# Patient Record
Sex: Female | Born: 1965
Health system: Southern US, Community
[De-identification: ages and names within clinical notes are randomized; demographics above are authoritative.]

## PROBLEM LIST (undated history)

## (undated) DIAGNOSIS — C50919 Malignant neoplasm of unspecified site of unspecified female breast: Secondary | ICD-10-CM

## (undated) DIAGNOSIS — F32A Depression, unspecified: Secondary | ICD-10-CM

## (undated) DIAGNOSIS — K219 Gastro-esophageal reflux disease without esophagitis: Secondary | ICD-10-CM

## (undated) DIAGNOSIS — C801 Malignant (primary) neoplasm, unspecified: Secondary | ICD-10-CM

## (undated) DIAGNOSIS — F329 Major depressive disorder, single episode, unspecified: Secondary | ICD-10-CM

## (undated) DIAGNOSIS — G43909 Migraine, unspecified, not intractable, without status migrainosus: Secondary | ICD-10-CM

## (undated) DIAGNOSIS — E7800 Pure hypercholesterolemia, unspecified: Secondary | ICD-10-CM

## (undated) HISTORY — PX: BREAST SURGERY: SHX581

## (undated) HISTORY — DX: Major depressive disorder, single episode, unspecified: F32.9

## (undated) HISTORY — DX: Migraine, unspecified, not intractable, without status migrainosus: G43.909

## (undated) HISTORY — DX: Depression, unspecified: F32.A

## (undated) HISTORY — DX: Malignant (primary) neoplasm, unspecified: C80.1

## (undated) HISTORY — PX: TONSILLECTOMY: SUR1361

---

## 1998-01-21 ENCOUNTER — Other Ambulatory Visit: Admission: RE | Admit: 1998-01-21 | Discharge: 1998-01-21 | Payer: Self-pay | Admitting: Obstetrics and Gynecology

## 1998-07-25 ENCOUNTER — Emergency Department (HOSPITAL_COMMUNITY): Admission: EM | Admit: 1998-07-25 | Discharge: 1998-07-25 | Payer: Self-pay | Admitting: Emergency Medicine

## 1999-01-26 ENCOUNTER — Other Ambulatory Visit: Admission: RE | Admit: 1999-01-26 | Discharge: 1999-01-26 | Payer: Self-pay | Admitting: Obstetrics and Gynecology

## 2000-01-28 ENCOUNTER — Other Ambulatory Visit: Admission: RE | Admit: 2000-01-28 | Discharge: 2000-01-28 | Payer: Self-pay | Admitting: Obstetrics and Gynecology

## 2001-03-06 ENCOUNTER — Other Ambulatory Visit: Admission: RE | Admit: 2001-03-06 | Discharge: 2001-03-06 | Payer: Self-pay | Admitting: Obstetrics and Gynecology

## 2001-10-03 DIAGNOSIS — C801 Malignant (primary) neoplasm, unspecified: Secondary | ICD-10-CM

## 2001-10-03 DIAGNOSIS — C50919 Malignant neoplasm of unspecified site of unspecified female breast: Secondary | ICD-10-CM

## 2001-10-03 HISTORY — PX: MASTECTOMY: SHX3

## 2001-10-03 HISTORY — DX: Malignant neoplasm of unspecified site of unspecified female breast: C50.919

## 2001-10-03 HISTORY — DX: Malignant (primary) neoplasm, unspecified: C80.1

## 2003-10-01 ENCOUNTER — Encounter: Admission: RE | Admit: 2003-10-01 | Discharge: 2003-10-01 | Payer: Self-pay | Admitting: Obstetrics and Gynecology

## 2003-10-02 ENCOUNTER — Encounter: Admission: RE | Admit: 2003-10-02 | Discharge: 2003-10-02 | Payer: Self-pay | Admitting: Obstetrics and Gynecology

## 2003-10-24 ENCOUNTER — Encounter (HOSPITAL_COMMUNITY): Admission: RE | Admit: 2003-10-24 | Discharge: 2004-01-22 | Payer: Self-pay | Admitting: Surgery

## 2003-10-30 ENCOUNTER — Encounter: Admission: RE | Admit: 2003-10-30 | Discharge: 2003-10-30 | Payer: Self-pay | Admitting: Surgery

## 2003-10-30 ENCOUNTER — Ambulatory Visit (HOSPITAL_COMMUNITY): Admission: RE | Admit: 2003-10-30 | Discharge: 2003-10-30 | Payer: Self-pay | Admitting: Surgery

## 2003-10-30 ENCOUNTER — Ambulatory Visit (HOSPITAL_BASED_OUTPATIENT_CLINIC_OR_DEPARTMENT_OTHER): Admission: RE | Admit: 2003-10-30 | Discharge: 2003-10-30 | Payer: Self-pay | Admitting: Surgery

## 2003-11-19 ENCOUNTER — Ambulatory Visit (HOSPITAL_COMMUNITY): Admission: RE | Admit: 2003-11-19 | Discharge: 2003-11-20 | Payer: Self-pay | Admitting: Surgery

## 2004-10-01 ENCOUNTER — Encounter: Admission: RE | Admit: 2004-10-01 | Discharge: 2004-10-01 | Payer: Self-pay | Admitting: Surgery

## 2007-07-10 ENCOUNTER — Ambulatory Visit: Payer: Self-pay

## 2008-10-06 ENCOUNTER — Ambulatory Visit: Payer: Self-pay

## 2008-10-09 ENCOUNTER — Ambulatory Visit: Payer: Self-pay

## 2009-12-02 ENCOUNTER — Ambulatory Visit: Payer: Self-pay

## 2010-12-14 ENCOUNTER — Ambulatory Visit: Payer: Self-pay | Admitting: Internal Medicine

## 2012-01-30 ENCOUNTER — Ambulatory Visit: Payer: Self-pay | Admitting: Internal Medicine

## 2012-11-29 ENCOUNTER — Ambulatory Visit: Payer: Self-pay | Admitting: Neurology

## 2013-01-30 ENCOUNTER — Ambulatory Visit: Payer: Self-pay | Admitting: Internal Medicine

## 2014-02-03 ENCOUNTER — Ambulatory Visit: Payer: Self-pay | Admitting: Internal Medicine

## 2015-02-09 ENCOUNTER — Other Ambulatory Visit: Payer: Self-pay

## 2015-02-09 DIAGNOSIS — G43009 Migraine without aura, not intractable, without status migrainosus: Secondary | ICD-10-CM | POA: Insufficient documentation

## 2015-02-09 DIAGNOSIS — Z6841 Body Mass Index (BMI) 40.0 and over, adult: Secondary | ICD-10-CM

## 2015-02-09 DIAGNOSIS — Z853 Personal history of malignant neoplasm of breast: Secondary | ICD-10-CM | POA: Insufficient documentation

## 2015-02-09 DIAGNOSIS — F325 Major depressive disorder, single episode, in full remission: Secondary | ICD-10-CM | POA: Insufficient documentation

## 2015-02-09 DIAGNOSIS — E7800 Pure hypercholesterolemia, unspecified: Secondary | ICD-10-CM | POA: Insufficient documentation

## 2015-02-09 DIAGNOSIS — Z1231 Encounter for screening mammogram for malignant neoplasm of breast: Secondary | ICD-10-CM

## 2015-02-12 ENCOUNTER — Telehealth: Payer: Self-pay

## 2015-02-12 ENCOUNTER — Ambulatory Visit (INDEPENDENT_AMBULATORY_CARE_PROVIDER_SITE_OTHER): Payer: Medicare Other | Admitting: Family Medicine

## 2015-02-12 VITALS — BP 120/80 | HR 75 | Temp 98.2°F | Resp 16 | Ht 65.0 in | Wt 238.0 lb

## 2015-02-12 DIAGNOSIS — M549 Dorsalgia, unspecified: Secondary | ICD-10-CM | POA: Diagnosis not present

## 2015-02-12 DIAGNOSIS — L989 Disorder of the skin and subcutaneous tissue, unspecified: Secondary | ICD-10-CM

## 2015-02-12 DIAGNOSIS — N393 Stress incontinence (female) (male): Secondary | ICD-10-CM | POA: Diagnosis not present

## 2015-02-12 DIAGNOSIS — N898 Other specified noninflammatory disorders of vagina: Secondary | ICD-10-CM

## 2015-02-12 DIAGNOSIS — R32 Unspecified urinary incontinence: Secondary | ICD-10-CM

## 2015-02-12 DIAGNOSIS — R300 Dysuria: Secondary | ICD-10-CM

## 2015-02-12 DIAGNOSIS — R238 Other skin changes: Secondary | ICD-10-CM

## 2015-02-12 LAB — POCT UA - MICROSCOPIC ONLY
Bacteria, U Microscopic: NEGATIVE
Casts, Ur, LPF, POC: NEGATIVE
Crystals, Ur, HPF, POC: NEGATIVE
Mucus, UA: NEGATIVE
RBC, urine, microscopic: NEGATIVE
WBC, Ur, HPF, POC: NEGATIVE
Yeast, UA: NEGATIVE

## 2015-02-12 LAB — POCT URINALYSIS DIPSTICK
Bilirubin, UA: NEGATIVE
Blood, UA: NEGATIVE
Glucose, UA: NEGATIVE
Ketones, UA: NEGATIVE
Leukocytes, UA: NEGATIVE
Nitrite, UA: NEGATIVE
Protein, UA: NEGATIVE
Spec Grav, UA: 1.015
Urobilinogen, UA: 0.2
pH, UA: 7.5

## 2015-02-12 LAB — POCT WET PREP WITH KOH
Clue Cells Wet Prep HPF POC: NEGATIVE
KOH Prep POC: NEGATIVE
Trichomonas, UA: NEGATIVE
WBC Wet Prep HPF POC: NEGATIVE
Yeast Wet Prep HPF POC: NEGATIVE

## 2015-02-12 NOTE — Progress Notes (Signed)
Chief Complaint:  Chief Complaint  Patient presents with  . Dysuria    Onset "a while"  . Vaginal Discharge    HPI: Bianca Fletcher is a 49 y.o. female who is here for vaginal dc for a "while", she went to see her ob.gyn Dr Ouida Sills and had a pap and also urine testing and wet prep this week and all was negative. She sttes she ahs some whit dc coming out of her vaginand and some burnign when she urinates. She denies fevers, chills, n/v/abd pain/pelvic pain She has not tried anything for this. She has has some irriation around her vaginal area. But it was from urinating so much. + odor. + back pain LMP 01/22/15  Past Medical History  Diagnosis Date  . Depression    Past Surgical History  Procedure Laterality Date  . Breast surgery     History   Social History  . Marital Status: Divorced    Spouse Name: N/A  . Number of Children: N/A  . Years of Education: N/A   Social History Main Topics  . Smoking status: Former Research scientist (life sciences)  . Smokeless tobacco: Not on file  . Alcohol Use: No  . Drug Use: No  . Sexual Activity: Not on file   Other Topics Concern  . None   Social History Narrative  . None   History reviewed. No pertinent family history. Allergies  Allergen Reactions  . Penicillins Rash   Prior to Admission medications   Medication Sig Start Date End Date Taking? Authorizing Provider  buPROPion (WELLBUTRIN XL) 150 MG 24 hr tablet Take 150 mg by mouth 2 (two) times daily.   Yes Historical Provider, MD  etodolac (LODINE) 400 MG tablet Take 400 mg by mouth 2 (two) times daily.   Yes Historical Provider, MD  propranolol (INDERAL) 80 MG tablet Take 80 mg by mouth once.   Yes Historical Provider, MD  simvastatin (ZOCOR) 20 MG tablet Take 20 mg by mouth daily.   Yes Historical Provider, MD  topiramate (TOPAMAX) 50 MG tablet Take 50 mg by mouth 2 (two) times daily.   Yes Historical Provider, MD     ROS: The patient denies fevers, chills, night sweats, unintentional  weight loss, chest pain, palpitations, wheezing, dyspnea on exertion, nausea, vomiting, abdominal pain,  hematuria, melena, numbness, weakness, or tingling.   All other systems have been reviewed and were otherwise negative with the exception of those mentioned in the HPI and as above.    PHYSICAL EXAM: Filed Vitals:   02/12/15 1216  BP: 120/80  Pulse: 75  Temp: 98.2 F (36.8 C)  Resp: 16   Filed Vitals:   02/12/15 1216  Height: 5\' 5"  (1.651 m)  Weight: 238 lb (107.956 kg)   Body mass index is 39.61 kg/(m^2).  General: Alert, anxious HEENT:  Normocephalic, atraumatic, oropharynx patent. EOMI, PERRLA Cardiovascular:  Regular rate and rhythm, no rubs murmurs or gallops.  Radial pulse intact. No pedal edema.  Respiratory: Clear to auscultation bilaterally.  No wheezes, rales, or rhonchi.  No cyanosis, no use of accessory musculature GI: No organomegaly, abdomen is soft and non-tender, positive bowel sounds.  No masses. Skin: No rashes. Neurologic: Facial musculature symmetric. Psychiatric: Patient is appropriate throughout our interaction. Lymphatic: No cervical lymphadenopathy Musculoskeletal: Gait intact. Perivaginal area slightly irritated.    LABS: Results for orders placed or performed in visit on 02/12/15  POCT UA - Microscopic Only  Result Value Ref Range   WBC, Ur,  HPF, POC neg    RBC, urine, microscopic neg    Bacteria, U Microscopic neg    Mucus, UA neg    Epithelial cells, urine per micros 0-2    Crystals, Ur, HPF, POC neg    Casts, Ur, LPF, POC neg    Yeast, UA neg   POCT urinalysis dipstick  Result Value Ref Range   Color, UA yellow    Clarity, UA clear    Glucose, UA neg    Bilirubin, UA neg    Ketones, UA neg    Spec Grav, UA 1.015    Blood, UA neg    pH, UA 7.5    Protein, UA neg    Urobilinogen, UA 0.2    Nitrite, UA neg    Leukocytes, UA Negative   POCT Wet Prep with KOH  Result Value Ref Range   Trichomonas, UA Negative    Clue Cells Wet  Prep HPF POC neg    Epithelial Wet Prep HPF POC 10-15    Yeast Wet Prep HPF POC neg    Bacteria Wet Prep HPF POC trace    RBC Wet Prep HPF POC 0-1    WBC Wet Prep HPF POC neg    KOH Prep POC Negative      EKG/XRAY:   Primary read interpreted by Dr. Marin Comment at [facility].   ASSESSMENT/PLAN: Encounter Diagnoses  Name Primary?  . Incontinence in female Yes  . Skin irritation   . Back pain, unspecified location   . Dysuria    49 yo female with PMH of Depression who is here for vaginal dc and also dysuria for over 2 weeks. She is UTD on pap , normal and also saw her ob gyn who states she does not have anything on exam.  Patient left without waiting for results, our interaction was slightly rushed and patient was anxious She was slightly tangential and yet fixated on vaginal dc Labs pending Fu prn   Gross sideeffects, risk and benefits, and alternatives of medications d/w patient. Patient is aware that all medications have potential sideeffects and we are unable to predict every sideeffect or drug-drug interaction that may occur.  LE, [place], DO 02/12/2015 2:20 PM   02/17/15--spoke with patient will rx diflucan x 1 only to see if help with vaginal dc and also irritation around skin. I told her it may be yeast but everything was negative on UA, wet prep, cx

## 2015-02-12 NOTE — Telephone Encounter (Signed)
Pt wants to know her lab results.  I advised her because she was just here, it will take some time and that we would call her with those results.  [phone]

## 2015-02-12 NOTE — Telephone Encounter (Signed)
I agree

## 2015-02-13 ENCOUNTER — Telehealth: Payer: Self-pay

## 2015-02-13 LAB — URINE CULTURE: Colony Count: 30000

## 2015-02-13 NOTE — Telephone Encounter (Signed)
Left message, labs are not back yet.

## 2015-02-13 NOTE — Telephone Encounter (Signed)
Patient called to get lab results. Please call! Patient hung up and didn't leave her best contact number.

## 2015-02-13 NOTE — Telephone Encounter (Signed)
Please call patient back wit her lab results at this number : [phone] She states she doesn't know her own phone number so I told her we can call the number that show's on the caller id. She also seems frustrated and asked several times when her lab results will be ready.

## 2015-02-14 NOTE — Telephone Encounter (Signed)
Pt is calling about her lab results °

## 2015-02-17 MED ORDER — FLUCONAZOLE 150 MG PO TABS: 150.0000 mg | ORAL_TABLET | Freq: Once | ORAL | Status: DC

## 2015-02-17 NOTE — Telephone Encounter (Signed)
Done sent in diflucan x 1, d/w patient labs, she needs to see ob.gyn if this continues

## 2015-02-20 ENCOUNTER — Ambulatory Visit
Admission: RE | Admit: 2015-02-20 | Discharge: 2015-02-20 | Disposition: A | Discharge status: Home / Self Care | Payer: Medicare Other | Source: Ambulatory Visit | Attending: Internal Medicine | Admitting: Internal Medicine

## 2015-02-20 DIAGNOSIS — Z1231 Encounter for screening mammogram for malignant neoplasm of breast: Secondary | ICD-10-CM | POA: Insufficient documentation

## 2015-02-20 DIAGNOSIS — Z9011 Acquired absence of right breast and nipple: Secondary | ICD-10-CM | POA: Insufficient documentation

## 2015-02-20 DIAGNOSIS — Z853 Personal history of malignant neoplasm of breast: Secondary | ICD-10-CM | POA: Insufficient documentation

## 2016-03-09 ENCOUNTER — Other Ambulatory Visit: Payer: Self-pay | Admitting: Internal Medicine

## 2016-03-09 DIAGNOSIS — Z1231 Encounter for screening mammogram for malignant neoplasm of breast: Secondary | ICD-10-CM

## 2016-03-17 ENCOUNTER — Other Ambulatory Visit: Payer: Self-pay | Admitting: Internal Medicine

## 2016-03-17 ENCOUNTER — Ambulatory Visit
Admission: RE | Admit: 2016-03-17 | Discharge: 2016-03-17 | Disposition: A | Discharge status: Home / Self Care | Payer: Medicare Other | Source: Ambulatory Visit | Attending: Internal Medicine | Admitting: Internal Medicine

## 2016-03-17 DIAGNOSIS — Z1231 Encounter for screening mammogram for malignant neoplasm of breast: Secondary | ICD-10-CM

## 2016-03-18 ENCOUNTER — Other Ambulatory Visit: Payer: Self-pay | Admitting: Internal Medicine

## 2016-03-18 DIAGNOSIS — Z1231 Encounter for screening mammogram for malignant neoplasm of breast: Secondary | ICD-10-CM

## 2016-06-29 ENCOUNTER — Ambulatory Visit: Payer: Medicare Other

## 2016-10-21 ENCOUNTER — Ambulatory Visit (HOSPITAL_COMMUNITY)
Admission: EM | Admit: 2016-10-21 | Discharge: 2016-10-21 | Disposition: A | Discharge status: Home / Self Care | Payer: Medicare Other | Attending: Family Medicine | Admitting: Family Medicine

## 2016-10-21 ENCOUNTER — Encounter (HOSPITAL_COMMUNITY): Payer: Self-pay | Admitting: *Deleted

## 2016-10-21 DIAGNOSIS — G8929 Other chronic pain: Secondary | ICD-10-CM | POA: Diagnosis not present

## 2016-10-21 DIAGNOSIS — M545 Low back pain, unspecified: Secondary | ICD-10-CM

## 2016-10-21 DIAGNOSIS — B9789 Other viral agents as the cause of diseases classified elsewhere: Secondary | ICD-10-CM

## 2016-10-21 DIAGNOSIS — J069 Acute upper respiratory infection, unspecified: Secondary | ICD-10-CM | POA: Diagnosis not present

## 2016-10-21 MED ORDER — DICLOFENAC SODIUM 75 MG PO TBEC: 75.0000 mg | DELAYED_RELEASE_TABLET | Freq: Two times a day (BID) | ORAL | 0 refills | Status: DC

## 2016-10-21 MED ORDER — DICLOFENAC SODIUM 75 MG PO TBEC
75.0000 mg | DELAYED_RELEASE_TABLET | Freq: Two times a day (BID) | ORAL | 0 refills | Status: DC
Start: 2016-10-21 — End: 2016-10-21

## 2016-10-21 NOTE — Discharge Instructions (Signed)
You most likely have a viral URI, I advise rest, plenty of fluids and management of symptoms with over the counter medicines. For symptoms you may take Tylenol as needed every 4-6 hours for body aches or fever, not to exceed 4,000 mg a day, Take mucinex or mucinex DM ever 12 hours with a full glass of water, you may use an inhaled steroid such as Flonase, 2 sprays each nostril once a day for congestion, or an antihistamine such as Claritin or Zyrtec once a day. Should your symptoms worsen or fail to resolve, follow up with your primary care provider or return to clinic.   For your back pain, you do not have any current signs or symptoms that concerning such as impingement signs, fever, loss of sensation or motor strength. I have written a prescription for Diclofenac, take 1 tablet twice a day. I recommend you establish with a primary care provider for further evaluation and management of your pain.

## 2016-10-21 NOTE — ED Provider Notes (Signed)
CSN: [id number]     Arrival date & time 10/21/16  1000 History   First MD Initiated Contact with Patient 10/21/16 1030     Chief Complaint  Patient presents with  . URI   (Consider location/radiation/quality/duration/timing/severity/associated sxs/prior Treatment) 51 year old female presents with 4 day history of congestion, no nausea, no vomiting, no diarrhea, no abdominal pain, no sinus pain or tenderness, no headache or ear pain. She also complains of 1-2 month history of lower back pain. Pain is not affected by movement, no history of trauma. Pain does not radiate down either leg or up her back. She has no loss of sensation in extremities, no loss of control of bowel or bladder, no fever or other red flag signs. She states her pain comes and goes and she reports she is not in pain at time of assessment but was hurting this morning.   The history is provided by the patient.  URI    Past Medical History:  Diagnosis Date  . Depression    Past Surgical History:  Procedure Laterality Date  . BREAST SURGERY    . MASTECTOMY Right 2013   positive   Family History  Problem Relation Age of Onset  . Breast cancer Maternal Grandmother    Social History  Substance Use Topics  . Smoking status: Former Research scientist (life sciences)  . Smokeless tobacco: Not on file  . Alcohol use No   OB History    No data available     Review of Systems  Reason unable to perform ROS: as covered in HPI.  All other systems reviewed and are negative.   Allergies  Penicillins  Home Medications   Prior to Admission medications   Medication Sig Start Date End Date Taking? Authorizing Provider  buPROPion (WELLBUTRIN XL) 150 MG 24 hr tablet Take 150 mg by mouth 2 (two) times daily.    Historical Provider, MD  diclofenac (VOLTAREN) 75 MG EC tablet Take 1 tablet (75 mg total) by mouth 2 (two) times daily. 10/21/16   Barnet Glasgow, NP  etodolac (LODINE) 400 MG tablet Take 400 mg by mouth 2 (two) times daily.    Historical  Provider, MD  fluconazole (DIFLUCAN) 150 MG tablet Take 1 tablet (150 mg total) by mouth once. 02/17/15   Thao P Le, DO  propranolol (INDERAL) 80 MG tablet Take 80 mg by mouth once.    Historical Provider, MD  simvastatin (ZOCOR) 20 MG tablet Take 20 mg by mouth daily.    Historical Provider, MD  topiramate (TOPAMAX) 50 MG tablet Take 50 mg by mouth 2 (two) times daily.    Historical Provider, MD   Meds Ordered and Administered this Visit  Medications - No data to display  BP 139/91 (BP Location: Left Arm)   Pulse 83   Temp 98 F (36.7 C) (Oral)   Resp 16   LMP 09/20/2016   SpO2 98%  No data found.   Physical Exam  Constitutional: She is oriented to person, place, and time. She appears well-developed and well-nourished. She does not have a sickly appearance. She does not appear ill. No distress.  HENT:  Head: Normocephalic and atraumatic.  Right Ear: External ear normal.  Left Ear: External ear normal.  Nose: Nose normal. Right sinus exhibits no maxillary sinus tenderness and no frontal sinus tenderness. Left sinus exhibits no maxillary sinus tenderness and no frontal sinus tenderness.  Mouth/Throat: Uvula is midline, oropharynx is clear and moist and mucous membranes are normal.  Eyes: Conjunctivae  are normal. Pupils are equal, round, and reactive to light.  Neck: Normal range of motion. Neck supple. No JVD present.  Cardiovascular: Normal rate and regular rhythm.   No murmur heard. Pulmonary/Chest: Effort normal and breath sounds normal. No respiratory distress.  Abdominal: Soft. Bowel sounds are normal. There is no tenderness.  Musculoskeletal: Normal range of motion. She exhibits no edema, tenderness or deformity.       Lumbar back: She exhibits normal range of motion, no tenderness, no bony tenderness, no swelling, no edema, no deformity, no laceration, no pain and no spasm.  Pain pain with extension or flexion of the lower back, negative bilateral straight leg test, no spinal  tenderness, no tenderness in the muscle groups of the lower back.  Lymphadenopathy:    She has no cervical adenopathy.  Neurological: She is alert and oriented to person, place, and time.  Skin: Skin is warm and dry. Capillary refill takes less than 2 seconds. She is not diaphoretic.  Nursing note and vitals reviewed.   Urgent Care Course     Procedures (including critical care time)  Labs Review Labs Reviewed - No data to display  Imaging Review No results found.   Visual Acuity Review  Right Eye Distance:   Left Eye Distance:   Bilateral Distance:    Right Eye Near:   Left Eye Near:    Bilateral Near:         MDM   1. Viral upper respiratory tract infection   2. Chronic bilateral low back pain without sciatica   You most likely have a viral URI, I advise rest, plenty of fluids and management of symptoms with over the counter medicines. For symptoms you may take Tylenol as needed every 4-6 hours for body aches or fever, not to exceed 4,000 mg a day, Take mucinex or mucinex DM ever 12 hours with a full glass of water, you may use an inhaled steroid such as Flonase, 2 sprays each nostril once a day for congestion, or an antihistamine such as Claritin or Zyrtec once a day. Should your symptoms worsen or fail to resolve, follow up with your primary care provider or return to clinic.   For your back pain, you do not have any current signs or symptoms that concerning such as impingement signs, fever, loss of sensation or motor strength. I have written a prescription for Diclofenac, take 1 tablet twice a day. I recommend you establish with a primary care provider for further evaluation and management of your pain.      Barnet Glasgow, NP 10/21/16 1056

## 2016-10-21 NOTE — ED Triage Notes (Signed)
Pt  Reports      Symptoms  Of  Cough   Congested     And  Nasal  stufyyness         Non  prodictive   Cough     Not  Relieved  By  otc  meds

## 2016-10-25 ENCOUNTER — Encounter (HOSPITAL_COMMUNITY): Payer: Self-pay | Admitting: Emergency Medicine

## 2016-10-25 ENCOUNTER — Emergency Department (HOSPITAL_COMMUNITY)
Admission: EM | Admit: 2016-10-25 | Discharge: 2016-10-25 | Disposition: A | Discharge status: Home / Self Care | Payer: Medicare Other | Attending: Emergency Medicine | Admitting: Emergency Medicine

## 2016-10-25 ENCOUNTER — Emergency Department (HOSPITAL_COMMUNITY): Payer: Medicare Other

## 2016-10-25 DIAGNOSIS — Z87891 Personal history of nicotine dependence: Secondary | ICD-10-CM | POA: Diagnosis not present

## 2016-10-25 DIAGNOSIS — J111 Influenza due to unidentified influenza virus with other respiratory manifestations: Secondary | ICD-10-CM | POA: Diagnosis not present

## 2016-10-25 DIAGNOSIS — R69 Illness, unspecified: Secondary | ICD-10-CM

## 2016-10-25 DIAGNOSIS — J069 Acute upper respiratory infection, unspecified: Secondary | ICD-10-CM

## 2016-10-25 LAB — URINALYSIS, ROUTINE W REFLEX MICROSCOPIC
Bacteria, UA: NONE SEEN
Bilirubin Urine: NEGATIVE
GLUCOSE, UA: NEGATIVE mg/dL
KETONES UR: NEGATIVE mg/dL
Leukocytes, UA: NEGATIVE
NITRITE: NEGATIVE
PH: 6 (ref 5.0–8.0)
PROTEIN: NEGATIVE mg/dL
Specific Gravity, Urine: 1.006 (ref 1.005–1.030)

## 2016-10-25 MED ORDER — ALBUTEROL SULFATE HFA 108 (90 BASE) MCG/ACT IN AERS
2.0000 | INHALATION_SPRAY | Freq: Once | RESPIRATORY_TRACT | Status: AC
  Administered 2016-10-25: 2 via RESPIRATORY_TRACT
  Filled 2016-10-25: qty 6.7

## 2016-10-25 MED ORDER — ALBUTEROL SULFATE HFA 108 (90 BASE) MCG/ACT IN AERS: 1.0000 | INHALATION_SPRAY | Freq: Four times a day (QID) | RESPIRATORY_TRACT | 0 refills | Status: DC | PRN

## 2016-10-25 MED ORDER — ONDANSETRON 4 MG PO TBDP: 4.0000 mg | ORAL_TABLET | Freq: Once | ORAL | Status: DC

## 2016-10-25 MED ORDER — ONDANSETRON 4 MG PO TBDP: 4.0000 mg | ORAL_TABLET | Freq: Three times a day (TID) | ORAL | 0 refills | Status: DC | PRN

## 2016-10-25 MED ORDER — ONDANSETRON 4 MG PO TBDP
ORAL_TABLET | ORAL | Status: AC
  Filled 2016-10-25: qty 1

## 2016-10-25 MED ORDER — BENZONATATE 100 MG PO CAPS: 100.0000 mg | ORAL_CAPSULE | Freq: Three times a day (TID) | ORAL | 0 refills | Status: DC

## 2016-10-25 NOTE — ED Notes (Signed)
Pt vomiting in the waiting room

## 2016-10-25 NOTE — ED Provider Notes (Signed)
[place] DEPT Provider Note   CSN: [id number] Arrival date & time: 10/25/16  1231   By signing my name below, I, Neta Mends, attest that this documentation has been prepared under the direction and in the presence of Tanna Furry, MD . Electronically Signed: Neta Mends, ED Scribe. 10/25/2016. 4:16 PM.    History   Chief Complaint Chief Complaint  Patient presents with  . Influenza   The history is provided by the patient. No language interpreter was used.   HPI Comments:  Bianca Fletcher is a 51 y.o. female who presents to the Emergency Department complaining of multiple URI symptoms for 3 days. Pt complains of associated persistent cough x 1 day, sneezing, generalized body aches, rhinorrhea, nausea, bladder incontinence. Pt was seen at Urgent Care where she did not have fever and was told that she has a viral infection, but was referred to the ED. Pt was given OTC medications at urgent care that provided no relief. Pt denies other associated symptoms.    Past Medical History:  Diagnosis Date  . Depression     There are no active problems to display for this patient.   Past Surgical History:  Procedure Laterality Date  . BREAST SURGERY    . MASTECTOMY Right 2013   positive    OB History    No data available       Home Medications    Prior to Admission medications   Medication Sig Start Date End Date Taking? Authorizing Provider  albuterol (PROVENTIL HFA;VENTOLIN HFA) 108 (90 Base) MCG/ACT inhaler Inhale 1-2 puffs into the lungs every 6 (six) hours as needed for wheezing. 10/25/16   Tanna Furry, MD  benzonatate (TESSALON) 100 MG capsule Take 1 capsule (100 mg total) by mouth every 8 (eight) hours. 10/25/16   Tanna Furry, MD  buPROPion (WELLBUTRIN XL) 150 MG 24 hr tablet Take 150 mg by mouth 2 (two) times daily.    Historical Provider, MD  diclofenac (VOLTAREN) 75 MG EC tablet Take 1 tablet (75 mg total) by mouth 2 (two) times daily. 10/21/16   Barnet Glasgow, NP  etodolac (LODINE) 400 MG tablet Take 400 mg by mouth 2 (two) times daily.    Historical Provider, MD  fluconazole (DIFLUCAN) 150 MG tablet Take 1 tablet (150 mg total) by mouth once. 02/17/15   Thao P Le, DO  ondansetron (ZOFRAN ODT) 4 MG disintegrating tablet Take 1 tablet (4 mg total) by mouth every 8 (eight) hours as needed for nausea. 10/25/16   Tanna Furry, MD  propranolol (INDERAL) 80 MG tablet Take 80 mg by mouth once.    Historical Provider, MD  simvastatin (ZOCOR) 20 MG tablet Take 20 mg by mouth daily.    Historical Provider, MD  topiramate (TOPAMAX) 50 MG tablet Take 50 mg by mouth 2 (two) times daily.    Historical Provider, MD    Family History Family History  Problem Relation Age of Onset  . Breast cancer Maternal Grandmother     Social History Social History  Substance Use Topics  . Smoking status: Former Research scientist (life sciences)  . Smokeless tobacco: Never Used  . Alcohol use No     Allergies   Penicillins   Review of Systems Review of Systems  Constitutional: Negative for appetite change, chills, diaphoresis, fatigue and fever.  HENT: Positive for rhinorrhea and sneezing. Negative for mouth sores, sore throat and trouble swallowing.   Eyes: Negative for visual disturbance.  Respiratory: Positive for cough. Negative for chest  tightness, shortness of breath and wheezing.   Cardiovascular: Negative for chest pain.  Gastrointestinal: Positive for nausea. Negative for abdominal distention, abdominal pain, diarrhea and vomiting.  Endocrine: Negative for polydipsia, polyphagia and polyuria.  Genitourinary: Negative for dysuria, frequency and hematuria.       Positive for bowel incontinence.  Musculoskeletal: Positive for myalgias. Negative for gait problem.  Skin: Negative for color change, pallor and rash.  Neurological: Negative for dizziness, syncope, light-headedness and headaches.  Hematological: Does not bruise/bleed easily.  Psychiatric/Behavioral: Negative for  behavioral problems and confusion.  All other systems reviewed and are negative.    Physical Exam Updated Vital Signs BP 128/75 (BP Location: Left Arm)   Pulse 87   Temp 98.1 F (36.7 C) (Oral)   Resp 18   Ht 5\' 5"  (1.651 m)   Wt 250 lb (113.4 kg)   SpO2 96%   BMI 41.60 kg/m   Physical Exam  Constitutional: She is oriented to person, place, and time. She appears well-developed and well-nourished. No distress.  HENT:  Head: Normocephalic.  Eyes: Conjunctivae are normal. Pupils are equal, round, and reactive to light. No scleral icterus.  Neck: Normal range of motion. Neck supple. No thyromegaly present.  Cardiovascular: Normal rate and regular rhythm.  Exam reveals no gallop and no friction rub.   No murmur heard. Pulmonary/Chest: Effort normal and breath sounds normal. No respiratory distress. She has no wheezes. She has no rales.  Abdominal: Soft. Bowel sounds are normal. She exhibits no distension. There is no tenderness. There is no rebound.  Musculoskeletal: Normal range of motion.  Neurological: She is alert and oriented to person, place, and time.  Skin: Skin is warm and dry. No rash noted.  Psychiatric: She has a normal mood and affect. Her behavior is normal.     ED Treatments / Results  DIAGNOSTIC STUDIES:  Oxygen Saturation is 96% on RA, normal by my interpretation.    COORDINATION OF CARE:  4:12 PM Pt asked for codeine but this request was denied due to her current symptoms. Will order an inhaler and urinalysis. Discussed treatment plan with pt at bedside and pt agreed to plan.   Labs (all labs ordered are listed, but only abnormal results are displayed) Labs Reviewed  URINALYSIS, ROUTINE W REFLEX MICROSCOPIC - Abnormal; Notable for the following:       Result Value   Color, Urine STRAW (*)    Hgb urine dipstick SMALL (*)    Squamous Epithelial / LPF 0-5 (*)    All other components within normal limits    EKG  EKG Interpretation None        Radiology Dg Chest 2 View  Result Date: 10/25/2016 CLINICAL DATA:  Flu symptoms.  Cough. EXAM: CHEST  2 VIEW COMPARISON:  No recent prior. FINDINGS: Mediastinum and hilar structures normal. Mild peribronchial thickening. Mild bronchitis cannot be excluded . No pleural effusion or pneumothorax. Mild cardiomegaly. No acute bony abnormality . IMPRESSION: Mild peribronchial thickening.  Mild bronchitis cannot be excluded. Electronically Signed   By: Marcello Moores  Register   On: 10/25/2016 14:24    Procedures Procedures (including critical care time)  Medications Ordered in ED Medications  ondansetron (ZOFRAN-ODT) disintegrating tablet 4 mg (not administered)  ondansetron (ZOFRAN-ODT) 4 MG disintegrating tablet (not administered)  albuterol (PROVENTIL HFA;VENTOLIN HFA) 108 (90 Base) MCG/ACT inhaler 2 puff (2 puffs Inhalation Given 10/25/16 1641)     Initial Impression / Assessment and Plan / ED Course  I have reviewed the triage  vital signs and the nursing notes.  Pertinent labs & imaging results that were available during my care of the patient were reviewed by me and considered in my medical decision making (see chart for details).     Patient has a green-yellow difficulty understanding her diagnosis to be a simple viral upper respiratory infection. Out of the window for Tamiflu. Plan is symptomatically treatment. This was explained to her on multiple occasions. Hopefully we have reached a point of understanding. Plan discharge as albuterol, Tessalon, and Zofran. Patient does not appear ill or toxic and has normal vital signs. She is afebrile. She is not hypoxemic.  Final Clinical Impressions(s) / ED Diagnoses   Final diagnoses:  Influenza  Viral upper respiratory tract infection  Influenza-like illness    New Prescriptions New Prescriptions   ALBUTEROL (PROVENTIL HFA;VENTOLIN HFA) 108 (90 BASE) MCG/ACT INHALER    Inhale 1-2 puffs into the lungs every 6 (six) hours as needed for  wheezing.   BENZONATATE (TESSALON) 100 MG CAPSULE    Take 1 capsule (100 mg total) by mouth every 8 (eight) hours.   ONDANSETRON (ZOFRAN ODT) 4 MG DISINTEGRATING TABLET    Take 1 tablet (4 mg total) by mouth every 8 (eight) hours as needed for nausea.  I personally performed the services described in this documentation, which was scribed in my presence. The recorded information has been reviewed and is accurate.     Tanna Furry, MD 10/25/16 [phone]

## 2016-10-25 NOTE — Discharge Instructions (Signed)
Tessalon for cough.  Zofran for nausea.  Albuterol for wheezing or cough. Rest. Push fluids. Ibuprofen for body aches.

## 2016-10-25 NOTE — ED Triage Notes (Signed)
Pt reports cough sore throat and  Stuffy head x several days was seen at [facility] and sent here for tx

## 2016-11-07 ENCOUNTER — Ambulatory Visit (INDEPENDENT_AMBULATORY_CARE_PROVIDER_SITE_OTHER): Payer: Medicare Other | Admitting: Family Medicine

## 2016-11-07 VITALS — BP 118/80 | HR 72 | Temp 97.5°F | Resp 16 | Ht 65.0 in | Wt 230.0 lb

## 2016-11-07 DIAGNOSIS — R29898 Other symptoms and signs involving the musculoskeletal system: Secondary | ICD-10-CM | POA: Diagnosis not present

## 2016-11-07 DIAGNOSIS — R05 Cough: Secondary | ICD-10-CM | POA: Diagnosis not present

## 2016-11-07 DIAGNOSIS — R059 Cough, unspecified: Secondary | ICD-10-CM

## 2016-11-07 DIAGNOSIS — J209 Acute bronchitis, unspecified: Secondary | ICD-10-CM | POA: Diagnosis not present

## 2016-11-07 MED ORDER — GUAIFENESIN-CODEINE 100-10 MG/5ML PO SOLN: 5.0000 mL | Freq: Every evening | ORAL | 0 refills | Status: DC | PRN

## 2016-11-07 NOTE — Progress Notes (Signed)
By signing my name below, I, Mesha Guinyard, attest that this documentation has been prepared under the direction and in the presence of Merri Ray, MD.  Electronically Signed: Verlee Monte, Medical Scribe. 11/07/16. 3:04 PM.  Subjective:    Patient ID: Bianca Fletcher, female    DOB: 07/24/1966, 51 y.o.   MRN: [id number]  HPI Chief Complaint  Patient presents with  . Cough    x 4 weeks    HPI Comments: Bianca Fletcher is a 51 y.o. female who [facility] complaining of constant cough onset 4.5 weeks. Pt was seen at [facility] on the 19th with 1.5 weeks of cough, congestion, and nasal congestion (Approximately 4 days based on the note, but clarified with patient she had symptoms for a week to week and a half prior to that visit). Thought to be a viral URI and recommended mucinex DM, tylenol, flonase, and zyrtec. Pt was compliant with flonase, and mucinex and found some relief to her sxs. When she was seen Jan 23rd, she still had some cough without any changes - she was afebrile at both visits. She had a reassuring exam, CXR that showed mild peribronchial thickining, and mild bronchitis on the 23rd. Given zofran for nausea, albuterol, and tessalon perles for her cough. She didn't notice if those medications gave relief of her cough so she discontinued them. Pt switched to nyquil since the tessalon perles didn't work. Pt then saw another provider, Barth Kirks Redmon at [facility], and was put on septra DS on Jan 31st and steroid nasal spray. She now has sleep loss due to the coughing. Pt would like to get codeine for her cough, denies any past difficulty with narcotics, or current use. Pt deferred blood work and CXR today. Denies fever or negative side effects from her abx.  Reports intermittent leg weakness for 1 month and 5 days (onset "this year"). Pt needed assistance getting off the couch at times and had to use a wheelchair at store at a time, but not  persistent weakness.  Denies any change in weakness with current illness.  There are no active problems to display for this patient.  Past Medical History:  Diagnosis Date  . Depression    Past Surgical History:  Procedure Laterality Date  . BREAST SURGERY    . MASTECTOMY Right 2013   positive   Allergies  Allergen Reactions  . Penicillins Rash   Prior to Admission medications   Medication Sig Start Date End Date Taking? Authorizing Provider  albuterol (PROVENTIL HFA;VENTOLIN HFA) 108 (90 Base) MCG/ACT inhaler Inhale 1-2 puffs into the lungs every 6 (six) hours as needed for wheezing. 10/25/16  Yes Tanna Furry, MD  buPROPion (WELLBUTRIN XL) 150 MG 24 hr tablet Take 150 mg by mouth 2 (two) times daily.   Yes Historical Provider, MD  fluconazole (DIFLUCAN) 150 MG tablet Take 1 tablet (150 mg total) by mouth once. 02/17/15  Yes Thao P Le, DO  propranolol (INDERAL) 80 MG tablet Take 80 mg by mouth once.   Yes Historical Provider, MD  simvastatin (ZOCOR) 20 MG tablet Take 20 mg by mouth daily.   Yes Historical Provider, MD  topiramate (TOPAMAX) 50 MG tablet Take 50 mg by mouth 2 (two) times daily.   Yes Historical Provider, MD  etodolac (LODINE) 400 MG tablet Take 400 mg by mouth 2 (two) times daily.    Historical Provider, MD   Social History   Social History  .  Marital status: Divorced    Spouse name: N/A  . Number of children: N/A  . Years of education: N/A   Occupational History  . Not on file.   Social History Main Topics  . Smoking status: Former Research scientist (life sciences)  . Smokeless tobacco: Never Used  . Alcohol use No  . Drug use: No  . Sexual activity: Not on file   Other Topics Concern  . Not on file   Social History Narrative  . No narrative on file   Review of Systems  Constitutional: Negative for fever.  HENT: Negative for congestion.   Respiratory: Positive for cough.   Neurological: Positive for weakness.  Psychiatric/Behavioral: Positive for sleep disturbance.    Objective:  Physical Exam  Constitutional: She appears well-developed and well-nourished. No distress.  HENT:  Head: Normocephalic and atraumatic.  Right Ear: External ear normal.  Left Ear: External ear normal.  Mouth/Throat: Oropharynx is clear and moist. No oropharyngeal exudate.  Edematous turbinates bilaterally  Eyes: Conjunctivae are normal.  Neck: Neck supple.  Cardiovascular: Normal rate, regular rhythm and normal heart sounds.  Exam reveals no gallop and no friction rub.   No murmur heard. Pulmonary/Chest: Effort normal and breath sounds normal. No respiratory distress. She has no wheezes. She has no rales.  Neurological: She is alert.  Reflex Scores:      Patellar reflexes are 2+ on the right side and 2+ on the left side.      Achilles reflexes are 2+ on the right side and 2+ on the left side. Able to stand without difficulty No lower extremity weakness appreciated  Skin: Skin is warm and dry.  Psychiatric: She has a normal mood and affect. Her behavior is normal.  Nursing note and vitals reviewed.  BP 118/80 (BP Location: Right Arm, Patient Position: Sitting, Cuff Size: Large)   Pulse 72   Temp 97.5 F (36.4 C) (Oral)   Resp 16   Ht 5\' 5"  (1.651 m)   Wt 230 lb (104.3 kg)   LMP 11/03/2016 (Approximate)   SpO2 97%   BMI 38.27 kg/m  Assessment & Plan:  Over 30 mins coordination of care with clarification of history, discussion on phone with Ms. Barrie Folk and review of prior notes  Control substance registry reviewed, no listings.   Bianca Fletcher is a 51 y.o. female Acute bronchitis, unspecified organism - Plan: guaiFENesin-codeine 100-10 MG/5ML syrup Cough - Plan: guaiFENesin-codeine 100-10 MG/5ML syrup  - Suspected viral illness initially, but persistent cough. Chest x-ray in the ER indicated some component of bronchitis. No focal infiltrate. Lungs clear on exam in office today, reassuring vitals, O2 sat normal. Discussed option of repeat chest x-ray to determine  if any interval change since previous x-ray, but this and blood work was declined. She was recently started on antibiotic for bronchitis, and tolerating that medication at this time.  - Symptomatic care discussed with Mucinex or Mucinex DM over-the-counter during the day, and codeine/guaifenesin cough syrup was prescribed for nighttime use. Side effects and risks of that medication were discussed and understanding expressed. RTC precautions discussed if persistent symptoms or any worsening of her symptoms.  Complaints of leg weakness  - No weakness appreciated on exam in office, reflexes were equal and normal in lower extremities. Intermittent nature but long-standing concern, so recommended follow-up with primary care provider and may need neurology evaluation. ER/911 precautions discussed if acute onset of weakness occurs again.  Meds ordered this encounter  Medications  . sulfamethoxazole-trimethoprim (BACTRIM DS,SEPTRA DS) 800-160  MG tablet    Sig: TAKE 1 TABLET BY MOUTH EVERY 12 HRS FOR 10 DAYS    Refill:  0  . guaiFENesin-codeine 100-10 MG/5ML syrup    Sig: Take 5-10 mLs by mouth at bedtime as needed for cough.    Dispense:  120 mL    Refill:  0   Patient Instructions   Your previous x-ray indicated bronchitis, and early on that is most likely due to a virus. However, due to the timing of your symptoms, the other medical provider started you on an antibiotic on January 31st for possible bacterial cause. Your lungs were clear today, oxygen level was within normal range, and temperature was in normal range.  We discussed a blood count or x-ray due to the persistent symptoms, but it may be too soon to tell if the antibiotic has work. I would recommend Mucinex or Mucinex DM during the day, codeine cough syrup if needed at night. If you have fever, shortness of breath, or any worsening of your symptoms, return here, emergency room, or other medical provider.  As we discussed, I would recommend  evaluation with neurology for your leg weakness, but I do not appreciate any weakness on exam today in the office. If that weakness happens again, I recommend you be seen immediately in the emergency room or call 911.   Return to the clinic or go to the nearest emergency room if any of your symptoms worsen or new symptoms occur.   Acute Bronchitis, Adult Acute bronchitis is sudden (acute) swelling of the air tubes (bronchi) in the lungs. Acute bronchitis causes these tubes to fill with mucus, which can make it hard to breathe. It can also cause coughing or wheezing. In adults, acute bronchitis usually goes away within 2 weeks. A cough caused by bronchitis may last up to 3 weeks. Smoking, allergies, and asthma can make the condition worse. Repeated episodes of bronchitis may cause further lung problems, such as chronic obstructive pulmonary disease (COPD). What are the causes? This condition can be caused by germs and by substances that irritate the lungs, including:  Cold and flu viruses. This condition is most often caused by the same virus that causes a cold.  Bacteria.  Exposure to tobacco smoke, dust, fumes, and air pollution. What increases the risk? This condition is more likely to develop in people who:  Have close contact with someone with acute bronchitis.  Are exposed to lung irritants, such as tobacco smoke, dust, fumes, and vapors.  Have a weak immune system.  Have a respiratory condition such as asthma. What are the signs or symptoms? Symptoms of this condition include:  A cough.  Coughing up clear, yellow, or green mucus.  Wheezing.  Chest congestion.  Shortness of breath.  A fever.  Body aches.  Chills.  A sore throat. How is this diagnosed? This condition is usually diagnosed with a physical exam. During the exam, your health care provider may order tests, such as chest X-rays, to rule out other conditions. He or she may also:  Test a sample of your  mucus for bacterial infection.  Check the level of oxygen in your blood. This is done to check for pneumonia.  Do a chest X-ray or lung function testing to rule out pneumonia and other conditions.  Perform blood tests. Your health care provider will also ask about your symptoms and medical history. How is this treated? Most cases of acute bronchitis clear up over time without treatment. Your health care  provider may recommend:  Drinking more fluids. Drinking more makes your mucus thinner, which may make it easier to breathe.  Taking a medicine for a fever or cough.  Taking an antibiotic medicine.  Using an inhaler to help improve shortness of breath and to control a cough.  Using a cool mist vaporizer or humidifier to make it easier to breathe. Follow these instructions at home: Medicines  Take over-the-counter and prescription medicines only as told by your health care provider.  If you were prescribed an antibiotic, take it as told by your health care provider. Do not stop taking the antibiotic even if you start to feel better. General instructions  Get plenty of rest.  Drink enough fluids to keep your urine clear or pale yellow.  Avoid smoking and secondhand smoke. Exposure to cigarette smoke or irritating chemicals will make bronchitis worse. If you smoke and you need help quitting, ask your health care provider. Quitting smoking will help your lungs heal faster.  Use an inhaler, cool mist vaporizer, or humidifier as told by your health care provider.  Keep all follow-up visits as told by your health care provider. This is important. How is this prevented? To lower your risk of getting this condition again:  Wash your hands often with soap and water. If soap and water are not available, use hand sanitizer.  Avoid contact with people who have cold symptoms.  Try not to touch your hands to your mouth, nose, or eyes.  Make sure to get the flu shot every year. Contact a  health care provider if:  Your symptoms do not improve in 2 weeks of treatment. Get help right away if:  You cough up blood.  You have chest pain.  You have severe shortness of breath.  You become dehydrated.  You faint or keep feeling like you are going to faint.  You keep vomiting.  You have a severe headache.  Your fever or chills gets worse. This information is not intended to replace advice given to you by your health care provider. Make sure you discuss any questions you have with your health care provider. Document Released: 10/27/2004 Document Revised: 04/13/2016 Document Reviewed: 03/09/2016 Elsevier Interactive Patient Education  2017 [employer]   Cough, Adult Coughing is a reflex that clears your throat and your airways. Coughing helps to heal and protect your lungs. It is normal to cough occasionally, but a cough that happens with other symptoms or lasts a long time may be a sign of a condition that needs treatment. A cough may last only 2-3 weeks (acute), or it may last longer than 8 weeks (chronic). What are the causes? Coughing is commonly caused by:  Breathing in substances that irritate your lungs.  A viral or bacterial respiratory infection.  Allergies.  Asthma.  Postnasal drip.  Smoking.  Acid backing up from the stomach into the esophagus (gastroesophageal reflux).  Certain medicines.  Chronic lung problems, including COPD (or rarely, lung cancer).  Other medical conditions such as heart failure. Follow these instructions at home: Pay attention to any changes in your symptoms. Take these actions to help with your discomfort:  Take medicines only as told by your health care provider.  If you were prescribed an antibiotic medicine, take it as told by your health care provider. Do not stop taking the antibiotic even if you start to feel better.  Talk with your health care provider before you take a cough suppressant medicine.  Drink  enough fluid to  keep your urine clear or pale yellow.  If the air is dry, use a cold steam vaporizer or humidifier in your bedroom or your home to help loosen secretions.  Avoid anything that causes you to cough at work or at home.  If your cough is worse at night, try sleeping in a semi-upright position.  Avoid cigarette smoke. If you smoke, quit smoking. If you need help quitting, ask your health care provider.  Avoid caffeine.  Avoid alcohol.  Rest as needed. Contact a health care provider if:  You have new symptoms.  You cough up pus.  Your cough does not get better after 2-3 weeks, or your cough gets worse.  You cannot control your cough with suppressant medicines and you are losing sleep.  You develop pain that is getting worse or pain that is not controlled with pain medicines.  You have a fever.  You have unexplained weight loss.  You have night sweats. Get help right away if:  You cough up blood.  You have difficulty breathing.  Your heartbeat is very fast. This information is not intended to replace advice given to you by your health care provider. Make sure you discuss any questions you have with your health care provider. Document Released: 03/18/2011 Document Revised: 02/25/2016 Document Reviewed: 11/26/2014 Elsevier Interactive Patient Education  2017 [employer].    IF you received an x-ray today, you will receive an invoice from [facility]. Please contact [facility] at [phone] with questions or concerns regarding your invoice.   IF you received labwork today, you will receive an invoice from [facility]. Please contact [facility] at [phone] with questions or concerns regarding your invoice.   Our billing staff will not be able to assist you with questions regarding bills from these companies.  You will be contacted with the lab results as soon as they are available. The fastest way to get your results is to activate your My  Chart account. Instructions are located on the last page of this paperwork. If you have not heard from [place] regarding the results in 2 weeks, please contact this office.       I personally performed the services described in this documentation, which was scribed in my presence. The recorded information has been reviewed and considered for accuracy and completeness, addended by me as needed, and agree with information above.  Signed,   Merri Ray, MD [facility].  11/07/16 7:53 PM

## 2016-11-07 NOTE — Patient Instructions (Addendum)
Your previous x-ray indicated bronchitis, and early on that is most likely due to a virus. However, due to the timing of your symptoms, the other medical provider started you on an antibiotic on January 31st for possible bacterial cause. Your lungs were clear today, oxygen level was within normal range, and temperature was in normal range.  We discussed a blood count or x-ray due to the persistent symptoms, but it may be too soon to tell if the antibiotic has work. I would recommend Mucinex or Mucinex DM during the day, codeine cough syrup if needed at night. If you have fever, shortness of breath, or any worsening of your symptoms, return here, emergency room, or other medical provider.  As we discussed, I would recommend evaluation with neurology for your leg weakness, but I do not appreciate any weakness on exam today in the office. If that weakness happens again, I recommend you be seen immediately in the emergency room or call 911.   Return to the clinic or go to the nearest emergency room if any of your symptoms worsen or new symptoms occur.   Acute Bronchitis, Adult Acute bronchitis is sudden (acute) swelling of the air tubes (bronchi) in the lungs. Acute bronchitis causes these tubes to fill with mucus, which can make it hard to breathe. It can also cause coughing or wheezing. In adults, acute bronchitis usually goes away within 2 weeks. A cough caused by bronchitis may last up to 3 weeks. Smoking, allergies, and asthma can make the condition worse. Repeated episodes of bronchitis may cause further lung problems, such as chronic obstructive pulmonary disease (COPD). What are the causes? This condition can be caused by germs and by substances that irritate the lungs, including:  Cold and flu viruses. This condition is most often caused by the same virus that causes a cold.  Bacteria.  Exposure to tobacco smoke, dust, fumes, and air pollution. What increases the risk? This condition is more  likely to develop in people who:  Have close contact with someone with acute bronchitis.  Are exposed to lung irritants, such as tobacco smoke, dust, fumes, and vapors.  Have a weak immune system.  Have a respiratory condition such as asthma. What are the signs or symptoms? Symptoms of this condition include:  A cough.  Coughing up clear, yellow, or green mucus.  Wheezing.  Chest congestion.  Shortness of breath.  A fever.  Body aches.  Chills.  A sore throat. How is this diagnosed? This condition is usually diagnosed with a physical exam. During the exam, your health care provider may order tests, such as chest X-rays, to rule out other conditions. He or she may also:  Test a sample of your mucus for bacterial infection.  Check the level of oxygen in your blood. This is done to check for pneumonia.  Do a chest X-ray or lung function testing to rule out pneumonia and other conditions.  Perform blood tests. Your health care provider will also ask about your symptoms and medical history. How is this treated? Most cases of acute bronchitis clear up over time without treatment. Your health care provider may recommend:  Drinking more fluids. Drinking more makes your mucus thinner, which may make it easier to breathe.  Taking a medicine for a fever or cough.  Taking an antibiotic medicine.  Using an inhaler to help improve shortness of breath and to control a cough.  Using a cool mist vaporizer or humidifier to make it easier to breathe. Follow  these instructions at home: Medicines  Take over-the-counter and prescription medicines only as told by your health care provider.  If you were prescribed an antibiotic, take it as told by your health care provider. Do not stop taking the antibiotic even if you start to feel better. General instructions  Get plenty of rest.  Drink enough fluids to keep your urine clear or pale yellow.  Avoid smoking and secondhand  smoke. Exposure to cigarette smoke or irritating chemicals will make bronchitis worse. If you smoke and you need help quitting, ask your health care provider. Quitting smoking will help your lungs heal faster.  Use an inhaler, cool mist vaporizer, or humidifier as told by your health care provider.  Keep all follow-up visits as told by your health care provider. This is important. How is this prevented? To lower your risk of getting this condition again:  Wash your hands often with soap and water. If soap and water are not available, use hand sanitizer.  Avoid contact with people who have cold symptoms.  Try not to touch your hands to your mouth, nose, or eyes.  Make sure to get the flu shot every year. Contact a health care provider if:  Your symptoms do not improve in 2 weeks of treatment. Get help right away if:  You cough up blood.  You have chest pain.  You have severe shortness of breath.  You become dehydrated.  You faint or keep feeling like you are going to faint.  You keep vomiting.  You have a severe headache.  Your fever or chills gets worse. This information is not intended to replace advice given to you by your health care provider. Make sure you discuss any questions you have with your health care provider. Document Released: 10/27/2004 Document Revised: 04/13/2016 Document Reviewed: 03/09/2016 Elsevier Interactive Patient Education  2017 [employer]   Cough, Adult Coughing is a reflex that clears your throat and your airways. Coughing helps to heal and protect your lungs. It is normal to cough occasionally, but a cough that happens with other symptoms or lasts a long time may be a sign of a condition that needs treatment. A cough may last only 2-3 weeks (acute), or it may last longer than 8 weeks (chronic). What are the causes? Coughing is commonly caused by:  Breathing in substances that irritate your lungs.  A viral or bacterial respiratory  infection.  Allergies.  Asthma.  Postnasal drip.  Smoking.  Acid backing up from the stomach into the esophagus (gastroesophageal reflux).  Certain medicines.  Chronic lung problems, including COPD (or rarely, lung cancer).  Other medical conditions such as heart failure. Follow these instructions at home: Pay attention to any changes in your symptoms. Take these actions to help with your discomfort:  Take medicines only as told by your health care provider.  If you were prescribed an antibiotic medicine, take it as told by your health care provider. Do not stop taking the antibiotic even if you start to feel better.  Talk with your health care provider before you take a cough suppressant medicine.  Drink enough fluid to keep your urine clear or pale yellow.  If the air is dry, use a cold steam vaporizer or humidifier in your bedroom or your home to help loosen secretions.  Avoid anything that causes you to cough at work or at home.  If your cough is worse at night, try sleeping in a semi-upright position.  Avoid cigarette smoke. If you smoke,  quit smoking. If you need help quitting, ask your health care provider.  Avoid caffeine.  Avoid alcohol.  Rest as needed. Contact a health care provider if:  You have new symptoms.  You cough up pus.  Your cough does not get better after 2-3 weeks, or your cough gets worse.  You cannot control your cough with suppressant medicines and you are losing sleep.  You develop pain that is getting worse or pain that is not controlled with pain medicines.  You have a fever.  You have unexplained weight loss.  You have night sweats. Get help right away if:  You cough up blood.  You have difficulty breathing.  Your heartbeat is very fast. This information is not intended to replace advice given to you by your health care provider. Make sure you discuss any questions you have with your health care provider. Document  Released: 03/18/2011 Document Revised: 02/25/2016 Document Reviewed: 11/26/2014 Elsevier Interactive Patient Education  2017 [employer].    IF you received an x-ray today, you will receive an invoice from [facility]. Please contact [facility] at [phone] with questions or concerns regarding your invoice.   IF you received labwork today, you will receive an invoice from [facility]. Please contact [facility] at [phone] with questions or concerns regarding your invoice.   Our billing staff will not be able to assist you with questions regarding bills from these companies.  You will be contacted with the lab results as soon as they are available. The fastest way to get your results is to activate your My Chart account. Instructions are located on the last page of this paperwork. If you have not heard from [place] regarding the results in 2 weeks, please contact this office.

## 2017-03-27 ENCOUNTER — Other Ambulatory Visit: Payer: Self-pay | Admitting: Internal Medicine

## 2017-03-27 ENCOUNTER — Encounter: Payer: Self-pay | Admitting: Gastroenterology

## 2017-03-28 ENCOUNTER — Other Ambulatory Visit: Payer: Self-pay | Admitting: Internal Medicine

## 2017-03-28 DIAGNOSIS — Z1231 Encounter for screening mammogram for malignant neoplasm of breast: Secondary | ICD-10-CM

## 2017-04-17 ENCOUNTER — Ambulatory Visit
Admission: RE | Admit: 2017-04-17 | Discharge: 2017-04-17 | Disposition: A | Discharge status: Home / Self Care | Payer: Medicare Other | Source: Ambulatory Visit | Attending: Internal Medicine | Admitting: Internal Medicine

## 2017-04-17 DIAGNOSIS — Z1231 Encounter for screening mammogram for malignant neoplasm of breast: Secondary | ICD-10-CM | POA: Diagnosis present

## 2017-05-10 ENCOUNTER — Encounter: Payer: Self-pay | Admitting: Internal Medicine

## 2017-05-24 ENCOUNTER — Encounter: Payer: 59 | Admitting: Gastroenterology

## 2017-08-08 DIAGNOSIS — N898 Other specified noninflammatory disorders of vagina: Secondary | ICD-10-CM | POA: Diagnosis not present

## 2017-10-18 DIAGNOSIS — E7800 Pure hypercholesterolemia, unspecified: Secondary | ICD-10-CM | POA: Diagnosis not present

## 2017-10-25 DIAGNOSIS — Z853 Personal history of malignant neoplasm of breast: Secondary | ICD-10-CM | POA: Diagnosis not present

## 2017-10-25 DIAGNOSIS — R634 Abnormal weight loss: Secondary | ICD-10-CM | POA: Diagnosis not present

## 2017-10-25 DIAGNOSIS — Z6839 Body mass index (BMI) 39.0-39.9, adult: Secondary | ICD-10-CM | POA: Diagnosis not present

## 2017-10-25 DIAGNOSIS — F3289 Other specified depressive episodes: Secondary | ICD-10-CM | POA: Diagnosis not present

## 2017-10-25 DIAGNOSIS — G43009 Migraine without aura, not intractable, without status migrainosus: Secondary | ICD-10-CM | POA: Diagnosis not present

## 2017-10-25 DIAGNOSIS — Z0000 Encounter for general adult medical examination without abnormal findings: Secondary | ICD-10-CM | POA: Diagnosis not present

## 2017-10-25 DIAGNOSIS — Z1389 Encounter for screening for other disorder: Secondary | ICD-10-CM | POA: Diagnosis not present

## 2017-10-25 DIAGNOSIS — F819 Developmental disorder of scholastic skills, unspecified: Secondary | ICD-10-CM | POA: Diagnosis not present

## 2017-10-25 DIAGNOSIS — E7800 Pure hypercholesterolemia, unspecified: Secondary | ICD-10-CM | POA: Diagnosis not present

## 2017-11-06 DIAGNOSIS — Z1212 Encounter for screening for malignant neoplasm of rectum: Secondary | ICD-10-CM | POA: Diagnosis not present

## 2017-11-07 ENCOUNTER — Encounter: Payer: Self-pay | Admitting: Internal Medicine

## 2017-12-28 ENCOUNTER — Encounter: Payer: Self-pay | Admitting: Gastroenterology

## 2018-01-25 DIAGNOSIS — Z01419 Encounter for gynecological examination (general) (routine) without abnormal findings: Secondary | ICD-10-CM | POA: Diagnosis not present

## 2018-02-07 ENCOUNTER — Other Ambulatory Visit: Payer: Self-pay

## 2018-02-07 ENCOUNTER — Ambulatory Visit (AMBULATORY_SURGERY_CENTER): Payer: Self-pay | Admitting: *Deleted

## 2018-02-07 VITALS — Ht 65.0 in | Wt 242.0 lb

## 2018-02-07 DIAGNOSIS — Z1211 Encounter for screening for malignant neoplasm of colon: Secondary | ICD-10-CM

## 2018-02-07 MED ORDER — NA SULFATE-K SULFATE-MG SULF 17.5-3.13-1.6 GM/177ML PO SOLN: 1.0000 | Freq: Once | ORAL | 0 refills | Status: AC

## 2018-02-07 NOTE — Progress Notes (Signed)
Patient denies any allergies to eggs or soy. Patient denies any problems with anesthesia/sedation. Patient denies any oxygen use at home. Patient denies taking any diet/weight loss medications or blood thinners. Patient's "care giver" came for the Suprep instructions during PV, she will help her with the prep.

## 2018-02-09 ENCOUNTER — Encounter: Payer: Self-pay | Admitting: Gastroenterology

## 2018-02-20 ENCOUNTER — Telehealth: Payer: Self-pay | Admitting: Gastroenterology

## 2018-02-20 NOTE — Telephone Encounter (Signed)
Pt canceled her procedure for tomorrow bc she is not feeling well. She rescheduled until July

## 2018-02-20 NOTE — Telephone Encounter (Signed)
Thanks for letting me know!

## 2018-02-21 ENCOUNTER — Encounter: Payer: Medicare Other | Admitting: Gastroenterology

## 2018-03-16 ENCOUNTER — Other Ambulatory Visit: Payer: Self-pay | Admitting: Internal Medicine

## 2018-03-16 DIAGNOSIS — Z1231 Encounter for screening mammogram for malignant neoplasm of breast: Secondary | ICD-10-CM

## 2018-04-09 ENCOUNTER — Other Ambulatory Visit: Payer: Self-pay

## 2018-04-09 ENCOUNTER — Encounter: Payer: Self-pay | Admitting: Gastroenterology

## 2018-04-09 ENCOUNTER — Ambulatory Visit (AMBULATORY_SURGERY_CENTER): Payer: PPO | Admitting: Gastroenterology

## 2018-04-09 VITALS — BP 143/71 | HR 77 | Temp 99.5°F | Resp 18 | Ht 65.0 in | Wt 230.0 lb

## 2018-04-09 DIAGNOSIS — K635 Polyp of colon: Secondary | ICD-10-CM

## 2018-04-09 DIAGNOSIS — Z1211 Encounter for screening for malignant neoplasm of colon: Secondary | ICD-10-CM

## 2018-04-09 DIAGNOSIS — D125 Benign neoplasm of sigmoid colon: Secondary | ICD-10-CM

## 2018-04-09 DIAGNOSIS — D124 Benign neoplasm of descending colon: Secondary | ICD-10-CM

## 2018-04-09 DIAGNOSIS — D120 Benign neoplasm of cecum: Secondary | ICD-10-CM

## 2018-04-09 DIAGNOSIS — D122 Benign neoplasm of ascending colon: Secondary | ICD-10-CM

## 2018-04-09 DIAGNOSIS — D126 Benign neoplasm of colon, unspecified: Secondary | ICD-10-CM

## 2018-04-09 MED ORDER — SODIUM CHLORIDE 0.9 % IV SOLN: 500.0000 mL | Freq: Once | INTRAVENOUS | Status: DC

## 2018-04-09 NOTE — Progress Notes (Signed)
Pt's states no medical or surgical changes since previsit or office visit.  Sarah Ward - is pt's gaurdianship and she was present and with pt while checking pt in the adimtting area.  Sarah Ward and pt had no questions re her consent form for Colonoscopy.  maw

## 2018-04-09 NOTE — Progress Notes (Signed)
A/ox3 pleased with MAC, report to RN 

## 2018-04-09 NOTE — Op Note (Signed)
[facility] Patient Name: Bianca Fletcher Procedure Date: 04/09/2018 2:54 PM MRN: [id number] Endoscopist: Remo Lipps P. Havery Moros , MD Age: 52 Referring MD:  Date of Birth: [birth year] Gender: Female Account #: [id number] Procedure:                Colonoscopy Indications:              Screening for colorectal malignant neoplasm, This                            is the patient's first colonoscopy Medicines:                Monitored Anesthesia Care Procedure:                Pre-Anesthesia Assessment:                           - Prior to the procedure, a History and Physical                            was performed, and patient medications and                            allergies were reviewed. The patient's tolerance of                            previous anesthesia was also reviewed. The risks                            and benefits of the procedure and the sedation                            options and risks were discussed with the patient.                            All questions were answered, and informed consent                            was obtained. Prior Anticoagulants: The patient has                            taken no previous anticoagulant or antiplatelet                            agents. ASA Grade Assessment: II - A patient with                            mild systemic disease. After reviewing the risks                            and benefits, the patient was deemed in                            satisfactory condition to undergo the procedure.  After obtaining informed consent, the colonoscope                            was passed under direct vision. Throughout the                            procedure, the patient's blood pressure, pulse, and                            oxygen saturations were monitored continuously. The                            Colonoscope was introduced through the anus and                            advanced to the the  cecum, identified by                            appendiceal orifice and ileocecal valve. The                            colonoscopy was performed without difficulty. The                            patient tolerated the procedure well. The quality                            of the bowel preparation was good. The ileocecal                            valve, appendiceal orifice, and rectum were                            photographed. Scope In: 3:04:28 PM Scope Out: 3:30:00 PM Scope Withdrawal Time: 0 hours 23 minutes 15 seconds  Total Procedure Duration: 0 hours 25 minutes 32 seconds  Findings:                 The perianal and digital rectal examinations were                            normal.                           A 8-10 mm polyp was found in the cecum. The polyp                            was flat. The polyp was removed with a cold snare.                            Resection and retrieval were complete.                           Three flat and sessile polyps were found in the  ascending colon. The polyps were 3 to 7 mm in size.                            These polyps were removed with a cold snare.                            Resection and retrieval were complete.                           A 3 mm polyp was found in the descending colon. The                            polyp was sessile. The polyp was removed with a                            cold snare. Resection and retrieval were complete.                           A 3 mm polyp was found in the sigmoid colon. The                            polyp was sessile. The polyp was removed with a                            cold snare. Resection and retrieval were complete.                           Internal hemorrhoids were found during                            retroflexion. The hemorrhoids were small.                           The exam was otherwise without abnormality. Complications:            No immediate  complications. Estimated blood loss:                            Minimal. Estimated Blood Loss:     Estimated blood loss was minimal. Impression:               - One 8-10 mm polyp in the cecum, removed with a                            cold snare. Resected and retrieved.                           - Three 3 to 7 mm polyps in the ascending colon,                            removed with a cold snare. Resected and retrieved.                           -  One 3 mm polyp in the descending colon, removed                            with a cold snare. Resected and retrieved.                           - One 3 mm polyp in the sigmoid colon, removed with                            a cold snare. Resected and retrieved.                           - Internal hemorrhoids.                           - The examination was otherwise normal. Recommendation:           - Patient has a contact number available for                            emergencies. The signs and symptoms of potential                            delayed complications were discussed with the                            patient. Return to normal activities tomorrow.                            Written discharge instructions were provided to the                            patient.                           - Resume previous diet.                           - Continue present medications.                           - Await pathology results.                           - Repeat colonoscopy for surveillance based on                            pathology results. Remo Lipps P. Tatia Petrucci, MD 04/09/2018 3:34:52 PM This report has been signed electronically.

## 2018-04-09 NOTE — Progress Notes (Signed)
Called to room to assist during endoscopic procedure.  Patient ID and intended procedure confirmed with present staff. Received instructions for my participation in the procedure from the performing physician.  

## 2018-04-09 NOTE — Patient Instructions (Signed)
YOU HAD AN ENDOSCOPIC PROCEDURE TODAY AT THE [facility]:   Refer to the procedure report that was given to you for any specific questions about what was found during the examination.  If the procedure report does not answer your questions, please call your gastroenterologist to clarify.  If you requested that your care partner not be given the details of your procedure findings, then the procedure report has been included in a sealed envelope for you to review at your convenience later.  YOU SHOULD EXPECT: Some feelings of bloating in the abdomen. Passage of more gas than usual.  Walking can help get rid of the air that was put into your GI tract during the procedure and reduce the bloating. If you had a lower endoscopy (such as a colonoscopy or flexible sigmoidoscopy) you may notice spotting of blood in your stool or on the toilet paper. If you underwent a bowel prep for your procedure, you may not have a normal bowel movement for a few days.  Please Note:  You might notice some irritation and congestion in your nose or some drainage.  This is from the oxygen used during your procedure.  There is no need for concern and it should clear up in a day or so.  SYMPTOMS TO REPORT IMMEDIATELY:   Following lower endoscopy (colonoscopy or flexible sigmoidoscopy):  Excessive amounts of blood in the stool  Significant tenderness or worsening of abdominal pains  Swelling of the abdomen that is new, acute  Fever of 100F or higher  For urgent or emergent issues, a gastroenterologist can be reached at any hour by calling [phone].   DIET:  We do recommend a small meal at first, but then you may proceed to your regular diet.  Drink plenty of fluids but you should avoid alcoholic beverages for 24 hours.  ACTIVITY:  You should plan to take it easy for the rest of today and you should NOT DRIVE or use heavy machinery until tomorrow (because of the sedation medicines used during the test).     FOLLOW UP: Our staff will call the number listed on your records the next business day following your procedure to check on you and address any questions or concerns that you may have regarding the information given to you following your procedure. If we do not reach you, we will leave a message.  However, if you are feeling well and you are not experiencing any problems, there is no need to return our call.  We will assume that you have returned to your regular daily activities without incident.  If any biopsies were taken you will be contacted by phone or by letter within the next 1-3 weeks.  Please call us at [phone] if you have not heard about the biopsies in 3 weeks.    SIGNATURES/CONFIDENTIALITY: You and/or your care partner have signed paperwork which will be entered into your electronic medical record.  These signatures attest to the fact that that the information above on your After Visit Summary has been reviewed and is understood.  Full responsibility of the confidentiality of this discharge information lies with you and/or your care-partner. 

## 2018-04-10 ENCOUNTER — Telehealth: Payer: Self-pay

## 2018-04-10 NOTE — Telephone Encounter (Signed)
  Follow up Call-  Call back number 04/09/2018  Post procedure Call Back phone  # [phone]  cell  Permission to leave phone message Yes  Some recent data might be hidden     Patient questions:  Do you have a fever, pain , or abdominal swelling? No. Pain Score  0 *  Have you tolerated food without any problems? Yes.    Have you been able to return to your normal activities? Yes.    Do you have any questions about your discharge instructions: Diet   No. Medications  No. Follow up visit  No.  Do you have questions or concerns about your Care? No.  Actions: * If pain score is 4 or above: No action needed, pain <4.

## 2018-04-13 ENCOUNTER — Encounter: Payer: Self-pay | Admitting: Gastroenterology

## 2018-04-19 ENCOUNTER — Ambulatory Visit
Admission: RE | Admit: 2018-04-19 | Discharge: 2018-04-19 | Disposition: A | Discharge status: Home / Self Care | Payer: PPO | Source: Ambulatory Visit | Attending: Internal Medicine | Admitting: Internal Medicine

## 2018-04-19 ENCOUNTER — Other Ambulatory Visit: Payer: Self-pay | Admitting: Internal Medicine

## 2018-04-19 DIAGNOSIS — Z1231 Encounter for screening mammogram for malignant neoplasm of breast: Secondary | ICD-10-CM

## 2018-04-19 HISTORY — DX: Malignant neoplasm of unspecified site of unspecified female breast: C50.919

## 2018-04-25 DIAGNOSIS — Z6839 Body mass index (BMI) 39.0-39.9, adult: Secondary | ICD-10-CM | POA: Diagnosis not present

## 2018-04-25 DIAGNOSIS — Z9114 Patient's other noncompliance with medication regimen: Secondary | ICD-10-CM | POA: Diagnosis not present

## 2018-04-25 DIAGNOSIS — E7800 Pure hypercholesterolemia, unspecified: Secondary | ICD-10-CM | POA: Diagnosis not present

## 2018-04-25 DIAGNOSIS — G43009 Migraine without aura, not intractable, without status migrainosus: Secondary | ICD-10-CM | POA: Diagnosis not present

## 2018-04-25 DIAGNOSIS — D126 Benign neoplasm of colon, unspecified: Secondary | ICD-10-CM | POA: Diagnosis not present

## 2018-04-25 DIAGNOSIS — F819 Developmental disorder of scholastic skills, unspecified: Secondary | ICD-10-CM | POA: Diagnosis not present

## 2018-04-25 DIAGNOSIS — F331 Major depressive disorder, recurrent, moderate: Secondary | ICD-10-CM | POA: Diagnosis not present

## 2018-10-19 DIAGNOSIS — R82998 Other abnormal findings in urine: Secondary | ICD-10-CM | POA: Diagnosis not present

## 2018-10-19 DIAGNOSIS — E7800 Pure hypercholesterolemia, unspecified: Secondary | ICD-10-CM | POA: Diagnosis not present

## 2018-10-26 DIAGNOSIS — D126 Benign neoplasm of colon, unspecified: Secondary | ICD-10-CM | POA: Diagnosis not present

## 2018-10-26 DIAGNOSIS — E7800 Pure hypercholesterolemia, unspecified: Secondary | ICD-10-CM | POA: Diagnosis not present

## 2018-10-26 DIAGNOSIS — Z853 Personal history of malignant neoplasm of breast: Secondary | ICD-10-CM | POA: Diagnosis not present

## 2018-10-26 DIAGNOSIS — R358 Other polyuria: Secondary | ICD-10-CM | POA: Diagnosis not present

## 2018-10-26 DIAGNOSIS — F819 Developmental disorder of scholastic skills, unspecified: Secondary | ICD-10-CM | POA: Diagnosis not present

## 2018-10-26 DIAGNOSIS — F331 Major depressive disorder, recurrent, moderate: Secondary | ICD-10-CM | POA: Diagnosis not present

## 2018-10-26 DIAGNOSIS — Z0000 Encounter for general adult medical examination without abnormal findings: Secondary | ICD-10-CM | POA: Diagnosis not present

## 2018-10-26 DIAGNOSIS — Z6841 Body Mass Index (BMI) 40.0 and over, adult: Secondary | ICD-10-CM | POA: Diagnosis not present

## 2018-10-26 DIAGNOSIS — Z1212 Encounter for screening for malignant neoplasm of rectum: Secondary | ICD-10-CM | POA: Diagnosis not present

## 2018-10-26 DIAGNOSIS — Z1331 Encounter for screening for depression: Secondary | ICD-10-CM | POA: Diagnosis not present

## 2018-10-26 DIAGNOSIS — G43009 Migraine without aura, not intractable, without status migrainosus: Secondary | ICD-10-CM | POA: Diagnosis not present

## 2019-03-12 ENCOUNTER — Other Ambulatory Visit: Payer: Self-pay | Admitting: Internal Medicine

## 2019-03-12 DIAGNOSIS — Z1231 Encounter for screening mammogram for malignant neoplasm of breast: Secondary | ICD-10-CM

## 2019-04-23 ENCOUNTER — Ambulatory Visit
Admission: RE | Admit: 2019-04-23 | Discharge: 2019-04-23 | Disposition: A | Discharge status: Home / Self Care | Payer: PPO | Source: Ambulatory Visit | Attending: Internal Medicine | Admitting: Internal Medicine

## 2019-04-23 ENCOUNTER — Other Ambulatory Visit: Payer: Self-pay

## 2019-04-23 DIAGNOSIS — Z1231 Encounter for screening mammogram for malignant neoplasm of breast: Secondary | ICD-10-CM | POA: Diagnosis not present

## 2019-05-03 DIAGNOSIS — Z853 Personal history of malignant neoplasm of breast: Secondary | ICD-10-CM | POA: Diagnosis not present

## 2019-05-03 DIAGNOSIS — G43009 Migraine without aura, not intractable, without status migrainosus: Secondary | ICD-10-CM | POA: Diagnosis not present

## 2019-05-03 DIAGNOSIS — E7800 Pure hypercholesterolemia, unspecified: Secondary | ICD-10-CM | POA: Diagnosis not present

## 2019-05-03 DIAGNOSIS — Z9114 Patient's other noncompliance with medication regimen: Secondary | ICD-10-CM | POA: Diagnosis not present

## 2019-05-03 DIAGNOSIS — D126 Benign neoplasm of colon, unspecified: Secondary | ICD-10-CM | POA: Diagnosis not present

## 2019-05-03 DIAGNOSIS — F819 Developmental disorder of scholastic skills, unspecified: Secondary | ICD-10-CM | POA: Diagnosis not present

## 2019-05-03 DIAGNOSIS — F331 Major depressive disorder, recurrent, moderate: Secondary | ICD-10-CM | POA: Diagnosis not present

## 2019-08-21 IMAGING — MG MM DIGITAL SCREENING UNILAT*L* W/ TOMO W/ CAD
6 series · 6 of 18 positions shown · non-contrast
Comparison: Previous exam(s).

CLINICAL DATA: Screening.

EXAM:
DIGITAL SCREENING UNILATERAL LEFT MAMMOGRAM WITH CAD AND TOMO

[L CC synth-2D (1 of 2)]
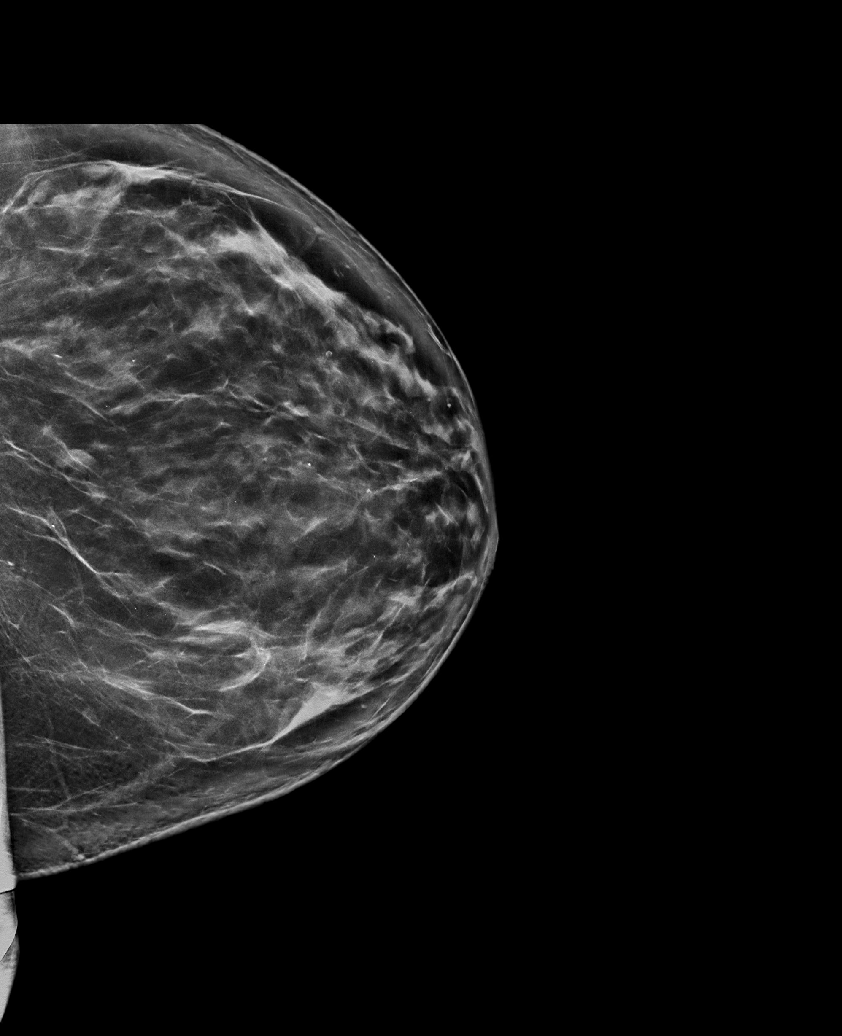

[L MLO synth-2D]
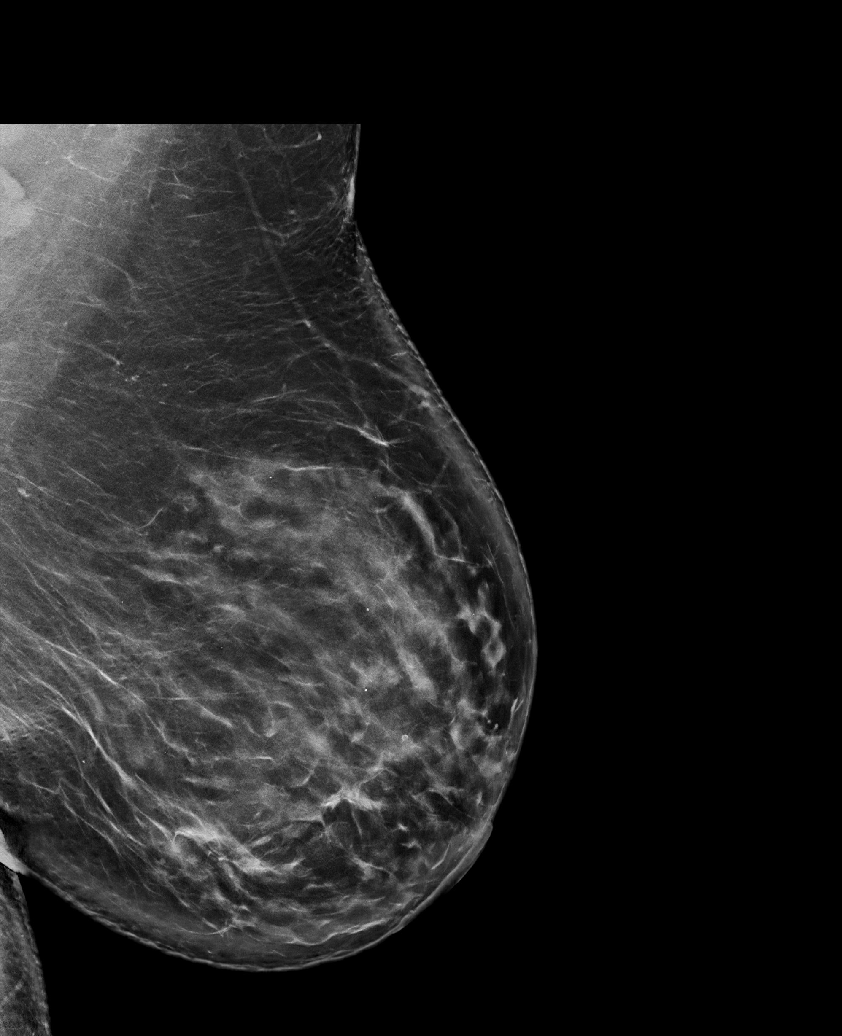

[L CC synth-2D (2 of 2)]
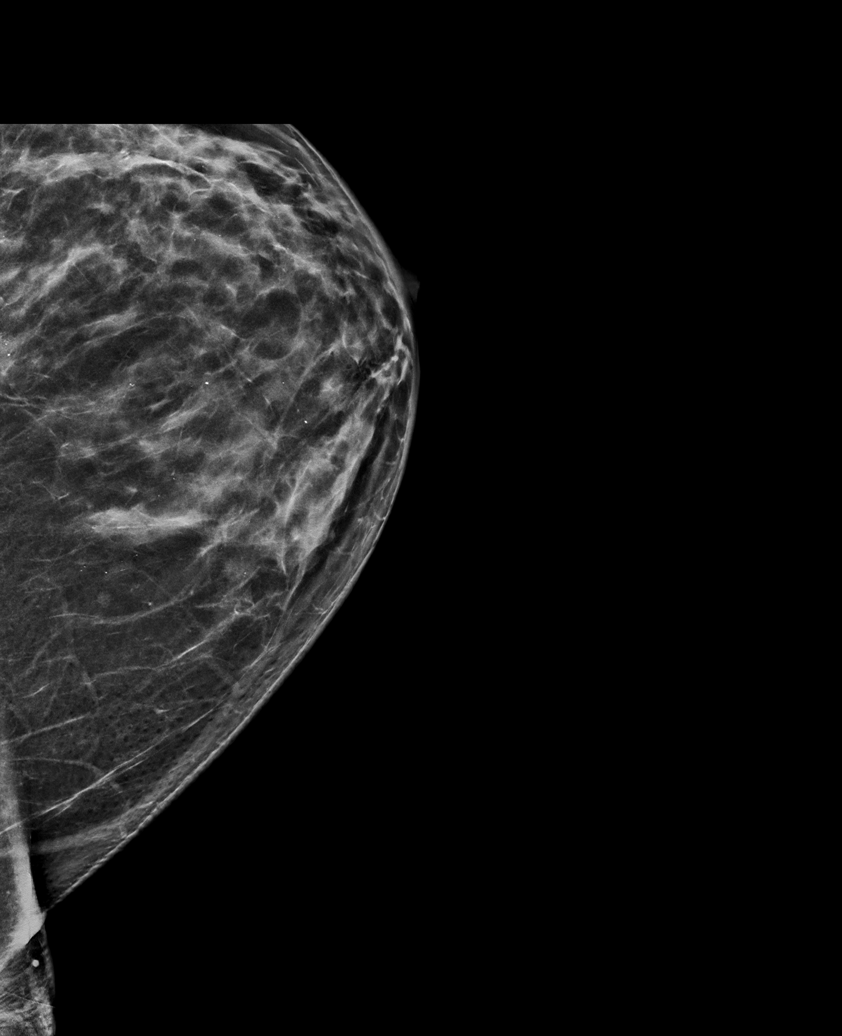

[L CC tomo (1 of 2) · tomo slice 37/73.0]
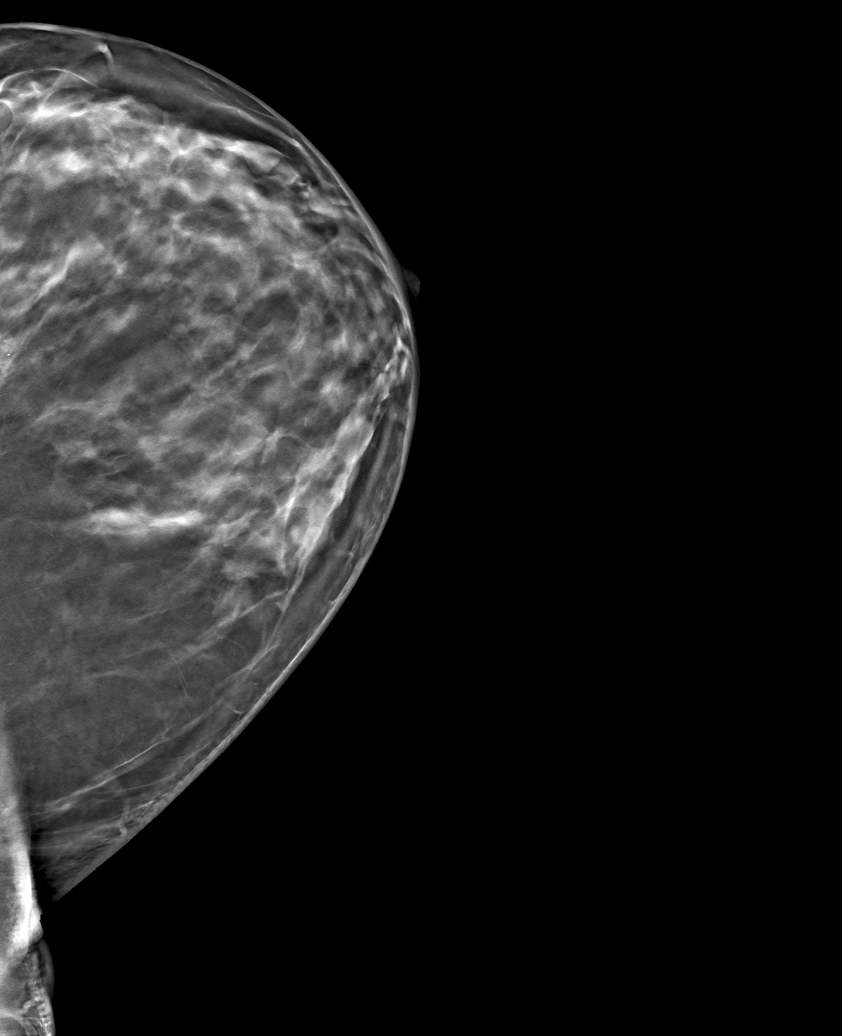

[L MLO tomo · tomo slice 55/108.0]
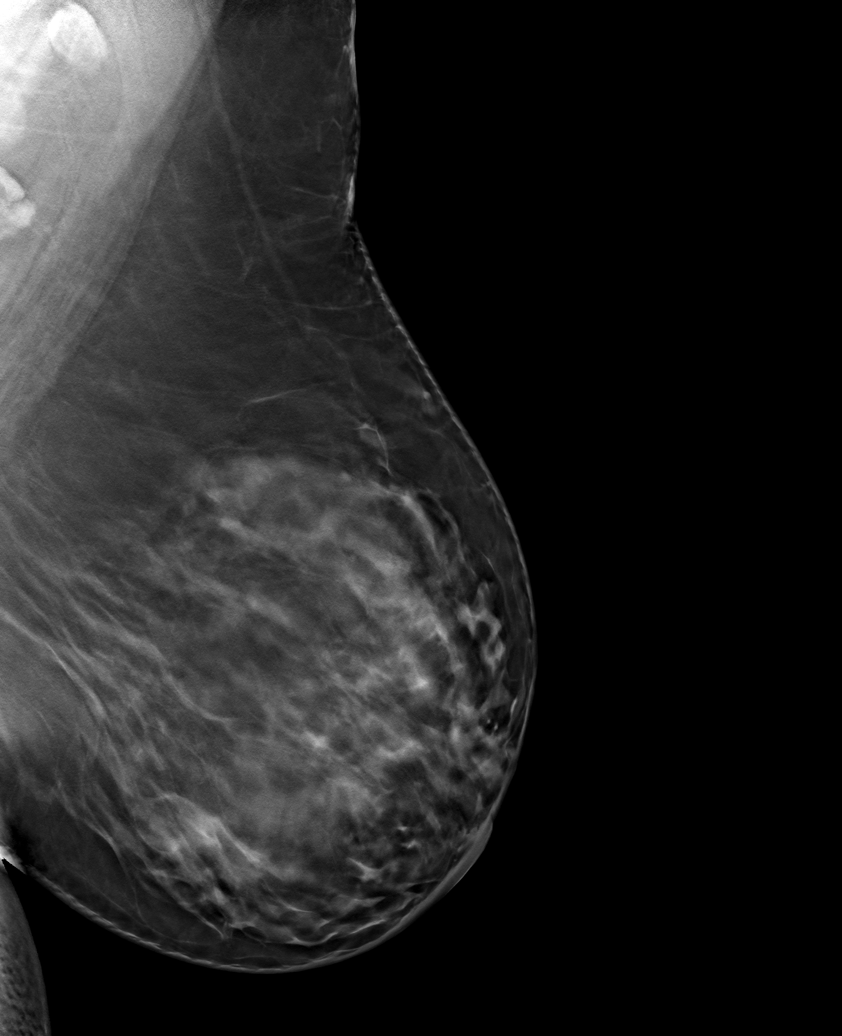

[L CC tomo (2 of 2) · tomo slice 49/96.0]
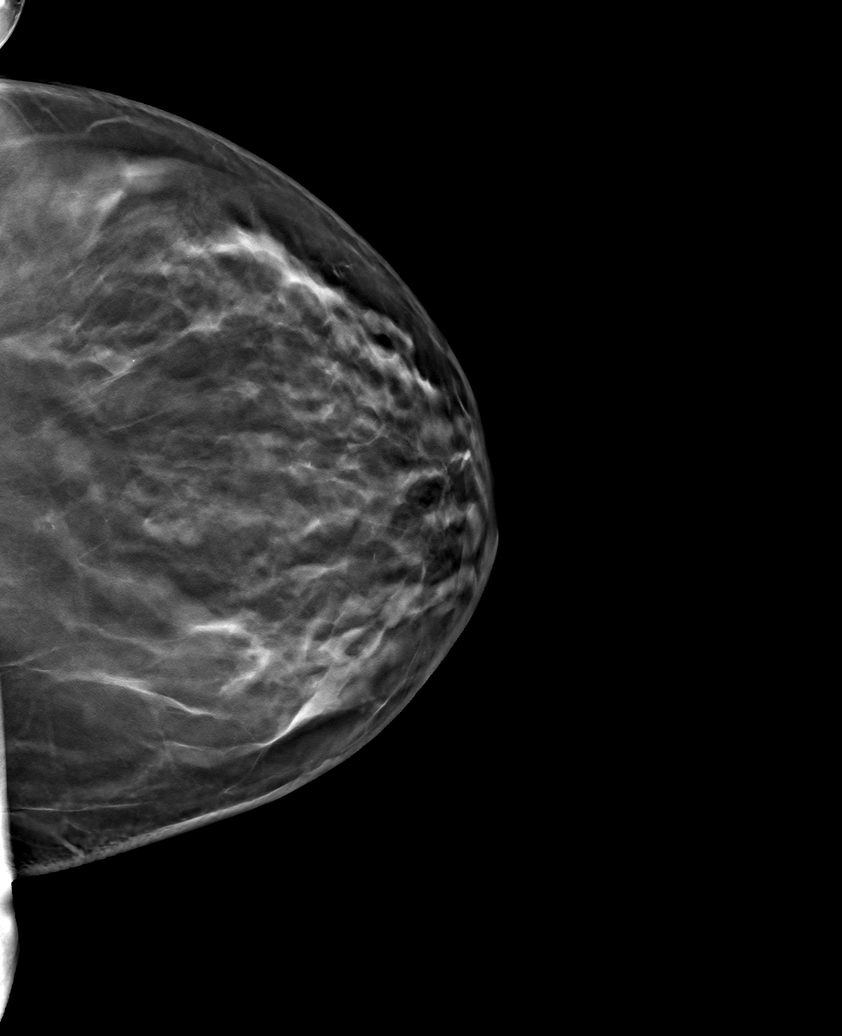

[6 of 18 positions shown; findings below may reference images not displayed]

ACR Breast Density Category c: The breast tissue is heterogeneously
dense, which may obscure small masses.
FINDINGS: The patient has had a right mastectomy. There are no findings
suspicious for malignancy.

Images were processed with CAD.
IMPRESSION: No mammographic evidence of malignancy. A result letter of this
screening mammogram will be mailed directly to the patient.

RECOMMENDATION:
Screening mammogram in one year.  (Code:SI-6-R6I)

BI-RADS CATEGORY  1: Negative.

## 2019-09-05 DIAGNOSIS — G43009 Migraine without aura, not intractable, without status migrainosus: Secondary | ICD-10-CM | POA: Diagnosis not present

## 2019-09-05 DIAGNOSIS — R52 Pain, unspecified: Secondary | ICD-10-CM | POA: Diagnosis not present

## 2019-09-05 DIAGNOSIS — R5383 Other fatigue: Secondary | ICD-10-CM | POA: Diagnosis not present

## 2019-09-05 DIAGNOSIS — F331 Major depressive disorder, recurrent, moderate: Secondary | ICD-10-CM | POA: Diagnosis not present

## 2019-09-05 DIAGNOSIS — K921 Melena: Secondary | ICD-10-CM | POA: Diagnosis not present

## 2019-09-05 DIAGNOSIS — Z9114 Patient's other noncompliance with medication regimen: Secondary | ICD-10-CM | POA: Diagnosis not present

## 2019-09-05 DIAGNOSIS — E7800 Pure hypercholesterolemia, unspecified: Secondary | ICD-10-CM | POA: Diagnosis not present

## 2019-09-06 DIAGNOSIS — K625 Hemorrhage of anus and rectum: Secondary | ICD-10-CM | POA: Diagnosis not present

## 2019-09-06 DIAGNOSIS — F331 Major depressive disorder, recurrent, moderate: Secondary | ICD-10-CM | POA: Diagnosis not present

## 2019-09-06 DIAGNOSIS — R5383 Other fatigue: Secondary | ICD-10-CM | POA: Diagnosis not present

## 2019-09-06 DIAGNOSIS — Z9114 Patient's other noncompliance with medication regimen: Secondary | ICD-10-CM | POA: Diagnosis not present

## 2019-09-06 DIAGNOSIS — R197 Diarrhea, unspecified: Secondary | ICD-10-CM | POA: Diagnosis not present

## 2019-10-22 DIAGNOSIS — E7800 Pure hypercholesterolemia, unspecified: Secondary | ICD-10-CM | POA: Diagnosis not present

## 2019-10-22 DIAGNOSIS — R82998 Other abnormal findings in urine: Secondary | ICD-10-CM | POA: Diagnosis not present

## 2019-10-24 DIAGNOSIS — Z1212 Encounter for screening for malignant neoplasm of rectum: Secondary | ICD-10-CM | POA: Diagnosis not present

## 2019-10-29 DIAGNOSIS — Z1339 Encounter for screening examination for other mental health and behavioral disorders: Secondary | ICD-10-CM | POA: Diagnosis not present

## 2019-10-29 DIAGNOSIS — Z9114 Patient's other noncompliance with medication regimen: Secondary | ICD-10-CM | POA: Diagnosis not present

## 2019-10-29 DIAGNOSIS — E7800 Pure hypercholesterolemia, unspecified: Secondary | ICD-10-CM | POA: Diagnosis not present

## 2019-10-29 DIAGNOSIS — Z853 Personal history of malignant neoplasm of breast: Secondary | ICD-10-CM | POA: Diagnosis not present

## 2019-10-29 DIAGNOSIS — D126 Benign neoplasm of colon, unspecified: Secondary | ICD-10-CM | POA: Diagnosis not present

## 2019-10-29 DIAGNOSIS — F819 Developmental disorder of scholastic skills, unspecified: Secondary | ICD-10-CM | POA: Diagnosis not present

## 2019-10-29 DIAGNOSIS — F331 Major depressive disorder, recurrent, moderate: Secondary | ICD-10-CM | POA: Diagnosis not present

## 2019-10-29 DIAGNOSIS — Z1331 Encounter for screening for depression: Secondary | ICD-10-CM | POA: Diagnosis not present

## 2019-10-29 DIAGNOSIS — G43009 Migraine without aura, not intractable, without status migrainosus: Secondary | ICD-10-CM | POA: Diagnosis not present

## 2019-10-29 DIAGNOSIS — Z0000 Encounter for general adult medical examination without abnormal findings: Secondary | ICD-10-CM | POA: Diagnosis not present

## 2020-02-02 ENCOUNTER — Emergency Department (HOSPITAL_COMMUNITY)
Admission: EM | Admit: 2020-02-02 | Discharge: 2020-02-02 | Disposition: A | Discharge status: Home / Self Care | Payer: PPO | Attending: Emergency Medicine | Admitting: Emergency Medicine

## 2020-02-02 ENCOUNTER — Other Ambulatory Visit: Payer: Self-pay

## 2020-02-02 ENCOUNTER — Encounter (HOSPITAL_COMMUNITY): Payer: Self-pay | Admitting: Emergency Medicine

## 2020-02-02 DIAGNOSIS — Z87891 Personal history of nicotine dependence: Secondary | ICD-10-CM | POA: Insufficient documentation

## 2020-02-02 DIAGNOSIS — L304 Erythema intertrigo: Secondary | ICD-10-CM | POA: Insufficient documentation

## 2020-02-02 DIAGNOSIS — N760 Acute vaginitis: Secondary | ICD-10-CM | POA: Diagnosis not present

## 2020-02-02 DIAGNOSIS — B9689 Other specified bacterial agents as the cause of diseases classified elsewhere: Secondary | ICD-10-CM

## 2020-02-02 DIAGNOSIS — Z853 Personal history of malignant neoplasm of breast: Secondary | ICD-10-CM | POA: Insufficient documentation

## 2020-02-02 DIAGNOSIS — N898 Other specified noninflammatory disorders of vagina: Secondary | ICD-10-CM | POA: Diagnosis present

## 2020-02-02 DIAGNOSIS — Z79899 Other long term (current) drug therapy: Secondary | ICD-10-CM | POA: Insufficient documentation

## 2020-02-02 LAB — WET PREP, GENITAL
Sperm: NONE SEEN
Trich, Wet Prep: NONE SEEN
Yeast Wet Prep HPF POC: NONE SEEN

## 2020-02-02 LAB — HIV ANTIBODY (ROUTINE TESTING W REFLEX): HIV Screen 4th Generation wRfx: NONREACTIVE

## 2020-02-02 MED ORDER — LORATADINE 10 MG PO TABS
10.0000 mg | ORAL_TABLET | Freq: Once | ORAL | Status: AC
  Administered 2020-02-02: 10 mg via ORAL
  Filled 2020-02-02: qty 1

## 2020-02-02 MED ORDER — METRONIDAZOLE 500 MG PO TABS
500.0000 mg | ORAL_TABLET | Freq: Two times a day (BID) | ORAL | 0 refills | Status: AC
Start: 2020-02-02 — End: 2020-02-09

## 2020-02-02 MED ORDER — FAMOTIDINE 20 MG PO TABS
20.0000 mg | ORAL_TABLET | Freq: Once | ORAL | Status: AC
Start: 2020-02-02 — End: 2020-02-02
  Administered 2020-02-02: 20 mg via ORAL
  Filled 2020-02-02: qty 1

## 2020-02-02 MED ORDER — CLOTRIMAZOLE 1 % EX CREA: TOPICAL_CREAM | CUTANEOUS | 0 refills | Status: AC

## 2020-02-02 NOTE — ED Provider Notes (Signed)
[facility] DEPT Provider Note   CSN: [id number] Arrival date & time: 02/02/20  0820     History Chief Complaint  Patient presents with  . Vaginal Discharge  . Rash    Bianca Fletcher is a 54 y.o. female.  HPI   54 year old female with history of breast cancer, migraine, depression, who presents to the emergency department today for evaluation of multiple complaints.  She is complaining of vaginal discharge and odor.  She does not have any vaginal itching.  She states she is not sexually active in the last year.  She has not had any abdominal or urinary complaints.  She also complains of a rash to her abdomen and groin area in her skin folds.  States that it has been somewhat pruritic and is now somewhat painful.  She further complains of diffuse pruritus that started last night.  She states that she tried to buy different laundry detergent but has not used it yet.  She denies any other new soaps, detergents, medications, foods or other environmental changes that she is aware of.  She denies that she has a diffuse rash.  Past Medical History:  Diagnosis Date  . Breast cancer [facility]) 2003   right breast  . Cancer ([place]) 2003   right breast-no other tx.  . Depression   . Migraine     Patient Active Problem List   Diagnosis Date Noted  . Depression, major, in remission ([place]) 02/09/2015  . History of breast cancer 02/09/2015  . Migraine without aura and without status migrainosus, not intractable 02/09/2015  . Morbid obesity with body mass index of 40.0-44.9 in adult ([place]) 02/09/2015  . Pure hypercholesterolemia 02/09/2015    Past Surgical History:  Procedure Laterality Date  . BREAST SURGERY    . MASTECTOMY Right 2003   positive  . TONSILLECTOMY       OB History   No obstetric history on file.     Family History  Problem Relation Age of Onset  . Alzheimer's disease Mother   . Kidney disease Father   . Ulcers Father   . Gout Father   .  Breast cancer Paternal Grandmother   . Colon cancer Neg Hx   . Esophageal cancer Neg Hx   . Rectal cancer Neg Hx   . Stomach cancer Neg Hx     Social History   Tobacco Use  . Smoking status: Former Research scientist (life sciences)  . Smokeless tobacco: Never Used  Substance Use Topics  . Alcohol use: No    Alcohol/week: 0.0 standard drinks  . Drug use: No    Home Medications Prior to Admission medications   Medication Sig Start Date End Date Taking? Authorizing Provider  buPROPion (WELLBUTRIN XL) 150 MG 24 hr tablet Take 150 mg by mouth 2 (two) times daily.    [provider]  clotrimazole (LOTRIMIN) 1 % cream Apply to affected area 2 times daily 02/02/20   Kien Mirsky S, PA-C  escitalopram (LEXAPRO) 10 MG tablet Take 1 tablet by mouth daily. 03/09/16   [provider]  meloxicam (MOBIC) 7.5 MG tablet Take 7.5 mg by mouth daily.    [provider]  metroNIDAZOLE (FLAGYL) 500 MG tablet Take 1 tablet (500 mg total) by mouth 2 (two) times daily for 7 days. 02/02/20 02/09/20  Ayham Word S, PA-C  propranolol (INDERAL) 80 MG tablet Take 80 mg by mouth once.    [provider]  simvastatin (ZOCOR) 20 MG tablet Take 20 mg  by mouth daily.    [provider]  topiramate (TOPAMAX) 50 MG tablet Take 100 mg by mouth 2 (two) times daily.     [provider]    Allergies    Amoxicillin, Butalbital-apap-caffeine, and Penicillins  Review of Systems   Review of Systems  Constitutional: Negative for fever.  HENT: Negative for ear pain and sore throat.   Eyes: Negative for visual disturbance.  Respiratory: Negative for cough and shortness of breath.   Cardiovascular: Negative for chest pain.  Gastrointestinal: Negative for abdominal pain, nausea and vomiting.  Genitourinary: Positive for vaginal discharge. Negative for dysuria, frequency, genital sores and hematuria.  Musculoskeletal: Negative for arthralgias and back pain.  Skin: Positive for rash.    Neurological: Negative for headaches.  All other systems reviewed and are negative.   Physical Exam Updated Vital Signs BP (!) 148/79   Pulse 72   Temp 98 F (36.7 C)   Resp 18   SpO2 98%   Physical Exam Vitals and nursing note reviewed.  Constitutional:      General: She is not in acute distress.    Appearance: She is well-developed.  HENT:     Head: Normocephalic and atraumatic.  Eyes:     Conjunctiva/sclera: Conjunctivae normal.  Cardiovascular:     Rate and Rhythm: Normal rate and regular rhythm.     Heart sounds: No murmur.  Pulmonary:     Effort: Pulmonary effort is normal. No respiratory distress.     Breath sounds: Normal breath sounds.  Abdominal:     Palpations: Abdomen is soft.     Tenderness: There is no abdominal tenderness.  Genitourinary:    Comments: Exam performed by Rodney Booze,  exam chaperoned Date: 02/02/2020 Pelvic exam: normal external genitalia without evidence of trauma. VULVA: normal appearing vulva with no masses, tenderness or lesion. VAGINA: normal appearing vagina with normal color and discharge, no lesions. CERVIX: normal appearing cervix without lesions, cervical motion tenderness absent, cervical os closed with out purulent discharge, Wet prep and DNA probe for chlamydia and GC obtained.   ADNEXA: normal adnexa in size, nontender and no masses UTERUS: uterus is normal size, shape, consistency and nontender.  Musculoskeletal:     Cervical back: Neck supple.  Skin:    General: Skin is warm and dry.     Comments: Erythematous, well circumscribed rash to the intertriginous areas of the lower abdomen. No evidence of superintection.  There is no rash elsewhere on the body  Neurological:     Mental Status: She is alert.     ED Results / Procedures / Treatments   Labs (all labs ordered are listed, but only abnormal results are displayed) Labs Reviewed  WET PREP, GENITAL - Abnormal; Notable for the following components:      Result  Value   Clue Cells Wet Prep HPF POC PRESENT (*)    WBC, Wet Prep HPF POC MODERATE (*)    All other components within normal limits  HIV ANTIBODY (ROUTINE TESTING W REFLEX)  RPR  URINALYSIS, ROUTINE W REFLEX MICROSCOPIC  GC/CHLAMYDIA PROBE AMP ([place]) NOT AT [facility]    EKG None  Radiology No results found.  Procedures Procedures (including critical care time)  Medications Ordered in ED Medications  famotidine (PEPCID) tablet 20 mg (20 mg Oral Given 02/02/20 1045)  loratadine (CLARITIN) tablet 10 mg (10 mg Oral Given 02/02/20 1055)    ED Course  I have reviewed the triage vital signs and the nursing notes.  Pertinent labs & imaging results that were available during my care of the patient were reviewed by me and considered in my medical decision making (see chart for details).    MDM Rules/Calculators/A&P                      54 year old female presenting for evaluation of multiple complaints including vaginal discharge and odor, rash and diffuse pruritus.  With regards to her vaginal discharge/odor, pelvic exam was performed.  She did not have any CMT, uterine or adnexal tenderness.  I did not appreciate any significant discharge on exam but it does look like on her wet prep she has clue cells and WBCs consistent with BV.  Will treat with Flagyl.  She denies any recent sexual encounters therefore will hold on treating her prophylactically for STDs.  GC/chlamydia, RPR pending.  HIV is negative.  With regards to her complaints of rash.  It does appear that the rash is consistent with intertrigo.  We will give her medication for this.  Etiology of the patient's diffuse pruritus is currently unclear however given the acute nature of her symptoms I do suspect that it is likely related to her environment.  I do not see any evidence of urticaria or other rashes to the remainder of the body.  Have advised her to take Benadryl at night for itching.  Advised that if the itching does not  improve she will need to follow-up with her PCP for further work-up.  Final Clinical Impression(s) / ED Diagnoses Final diagnoses:  Intertrigo  Bacterial vaginosis    Rx / DC Orders ED Discharge Orders         Ordered    clotrimazole (LOTRIMIN) 1 % cream     02/02/20 1354    metroNIDAZOLE (FLAGYL) 500 MG tablet  2 times daily     02/02/20 1354           Rodney Booze, PA-C 02/02/20 1355    Wyvonnia Dusky, MD 02/02/20 1752

## 2020-02-02 NOTE — Discharge Instructions (Signed)
Use the lotrimin cream twice daily for two weeks.  You were given a prescription for antibiotics to treat bacterial vaginosis. Please take the antibiotic prescription fully. Do not drink alcohol while taking this medication.   You may use one tablet of benadryl every 6 hours as needed for itching. Do not drive or go to work while taking this medication as it can make you very sleepy.   Please follow up with your primary care provider within 5-7 days for re-evaluation of your symptoms. If you do not have a primary care provider, information for a healthcare clinic has been provided for you to make arrangements for follow up care. Please return to the emergency department for any new or worsening symptoms.

## 2020-02-02 NOTE — ED Notes (Signed)
An After Visit Summary was printed and given to the patient. Discharge instructions given and no further questions at this time.  

## 2020-02-02 NOTE — ED Triage Notes (Signed)
Pt reports vaginal odor for "while". Noticed rash and itching all over yesterday as well as white vaginal discharge with little white bug like. Reports that rash area burns.

## 2020-02-03 LAB — GC/CHLAMYDIA PROBE AMP (~~LOC~~) NOT AT ARMC
Chlamydia: NEGATIVE
Comment: NEGATIVE
Comment: NORMAL
Neisseria Gonorrhea: NEGATIVE

## 2020-02-03 LAB — RPR: RPR Ser Ql: NONREACTIVE

## 2020-03-25 ENCOUNTER — Other Ambulatory Visit: Payer: Self-pay | Admitting: Internal Medicine

## 2020-03-25 DIAGNOSIS — Z1231 Encounter for screening mammogram for malignant neoplasm of breast: Secondary | ICD-10-CM

## 2020-04-29 ENCOUNTER — Ambulatory Visit
Admission: RE | Admit: 2020-04-29 | Discharge: 2020-04-29 | Disposition: A | Discharge status: Home / Self Care | Payer: PPO | Source: Ambulatory Visit | Attending: Internal Medicine | Admitting: Internal Medicine

## 2020-04-29 DIAGNOSIS — Z1231 Encounter for screening mammogram for malignant neoplasm of breast: Secondary | ICD-10-CM | POA: Diagnosis not present

## 2020-04-30 ENCOUNTER — Other Ambulatory Visit: Payer: Self-pay | Admitting: Internal Medicine

## 2020-05-07 ENCOUNTER — Other Ambulatory Visit: Payer: Self-pay | Admitting: Internal Medicine

## 2020-05-07 DIAGNOSIS — R921 Mammographic calcification found on diagnostic imaging of breast: Secondary | ICD-10-CM

## 2020-05-07 DIAGNOSIS — R928 Other abnormal and inconclusive findings on diagnostic imaging of breast: Secondary | ICD-10-CM

## 2021-05-29 ENCOUNTER — Emergency Department (HOSPITAL_COMMUNITY)
Admission: EM | Admit: 2021-05-29 | Discharge: 2021-05-29 | Disposition: A | Discharge status: Home / Self Care | Payer: Medicare Other | Attending: Emergency Medicine | Admitting: Emergency Medicine

## 2021-05-29 ENCOUNTER — Encounter (HOSPITAL_COMMUNITY): Payer: Self-pay

## 2021-05-29 ENCOUNTER — Other Ambulatory Visit: Payer: Self-pay

## 2021-05-29 DIAGNOSIS — K649 Unspecified hemorrhoids: Secondary | ICD-10-CM | POA: Insufficient documentation

## 2021-05-29 DIAGNOSIS — Z87891 Personal history of nicotine dependence: Secondary | ICD-10-CM | POA: Insufficient documentation

## 2021-05-29 DIAGNOSIS — N3001 Acute cystitis with hematuria: Secondary | ICD-10-CM | POA: Diagnosis not present

## 2021-05-29 DIAGNOSIS — Z853 Personal history of malignant neoplasm of breast: Secondary | ICD-10-CM | POA: Diagnosis not present

## 2021-05-29 DIAGNOSIS — K625 Hemorrhage of anus and rectum: Secondary | ICD-10-CM | POA: Diagnosis present

## 2021-05-29 DIAGNOSIS — B9689 Other specified bacterial agents as the cause of diseases classified elsewhere: Secondary | ICD-10-CM | POA: Insufficient documentation

## 2021-05-29 DIAGNOSIS — N390 Urinary tract infection, site not specified: Secondary | ICD-10-CM

## 2021-05-29 LAB — POC OCCULT BLOOD, ED: Fecal Occult Bld: POSITIVE — AB

## 2021-05-29 LAB — CBC
HCT: 41.2 % (ref 36.0–46.0)
Hemoglobin: 13 g/dL (ref 12.0–15.0)
MCH: 28 pg (ref 26.0–34.0)
MCHC: 31.6 g/dL (ref 30.0–36.0)
MCV: 88.6 fL (ref 80.0–100.0)
Platelets: 279 10*3/uL (ref 150–400)
RBC: 4.65 MIL/uL (ref 3.87–5.11)
RDW: 13.9 % (ref 11.5–15.5)
WBC: 9 10*3/uL (ref 4.0–10.5)
nRBC: 0 % (ref 0.0–0.2)

## 2021-05-29 LAB — COMPREHENSIVE METABOLIC PANEL
ALT: 25 U/L (ref 0–44)
AST: 23 U/L (ref 15–41)
Albumin: 3.8 g/dL (ref 3.5–5.0)
Alkaline Phosphatase: 95 U/L (ref 38–126)
Anion gap: 6 (ref 5–15)
BUN: 15 mg/dL (ref 6–20)
CO2: 25 mmol/L (ref 22–32)
Calcium: 9 mg/dL (ref 8.9–10.3)
Chloride: 108 mmol/L (ref 98–111)
Creatinine, Ser: 0.84 mg/dL (ref 0.44–1.00)
GFR, Estimated: >60 mL/min (ref >60)
Glucose, Bld: 100 mg/dL — ABNORMAL HIGH (ref 70–99)
Potassium: 3.9 mmol/L (ref 3.5–5.1)
Sodium: 139 mmol/L (ref 135–145)
Total Bilirubin: 0.6 mg/dL (ref 0.3–1.2)
Total Protein: 7.1 g/dL (ref 6.5–8.1)

## 2021-05-29 LAB — URINALYSIS, ROUTINE W REFLEX MICROSCOPIC
Bilirubin Urine: NEGATIVE
Glucose, UA: NEGATIVE mg/dL
Ketones, ur: NEGATIVE mg/dL
Nitrite: NEGATIVE
Protein, ur: NEGATIVE mg/dL
Specific Gravity, Urine: 1.005 (ref 1.005–1.030)
pH: 6 (ref 5.0–8.0)

## 2021-05-29 LAB — LIPASE, BLOOD: Lipase: 32 U/L (ref 11–51)

## 2021-05-29 MED ORDER — PANTOPRAZOLE SODIUM 40 MG IV SOLR
40.0000 mg | Freq: Once | INTRAVENOUS | Status: AC
  Administered 2021-05-29: 40 mg via INTRAVENOUS
  Filled 2021-05-29: qty 40

## 2021-05-29 MED ORDER — NITROFURANTOIN MONOHYD MACRO 100 MG PO CAPS: 100.0000 mg | ORAL_CAPSULE | Freq: Two times a day (BID) | ORAL | 0 refills | Status: AC

## 2021-05-29 MED ORDER — DOCUSATE SODIUM 250 MG PO CAPS
250.0000 mg | ORAL_CAPSULE | Freq: Every day | ORAL | 0 refills | Status: AC
Start: 2021-05-29 — End: 2021-06-12

## 2021-05-29 NOTE — ED Provider Notes (Signed)
[place] DEPT Provider Note   CSN: [id number] Arrival date & time: 05/29/21  1647     History Chief Complaint  Patient presents with   Rectal Bleeding    Bianca Fletcher is a 55 y.o. female.  55 year old female history as below presented ER secondary to blood with bowel movement, and abdominal pain.  Onset of symptoms yesterday evening.  Discomfort just prior to abdominal movement, generalized, cramping sensation.  Not currently transabdominal pain.  Tolerant oral intake without difficulty, no nausea or vomiting.  Patient reports scant blood in her stool.  No pain with defecation.  No melena.  No hematuria.  No hematemesis.  Denies rectal trauma.  Denies history of similar symptoms in the past.  Does not follow gastroenterology.  The history is provided by the patient. No language interpreter was used.  Rectal Bleeding Associated symptoms: abdominal pain   Associated symptoms: no fever and no vomiting       Past Medical History:  Diagnosis Date   Breast cancer [facility]) 2003   right breast   Cancer ([place]) 2003   right breast-no other tx.   Depression    Migraine     Patient Active Problem List   Diagnosis Date Noted   Depression, major, in remission ([place]) 02/09/2015   History of breast cancer 02/09/2015   Migraine without aura and without status migrainosus, not intractable 02/09/2015   Morbid obesity with body mass index of 40.0-44.9 in adult [facility]) 02/09/2015   Pure hypercholesterolemia 02/09/2015    Past Surgical History:  Procedure Laterality Date   BREAST SURGERY     MASTECTOMY Right 2003   positive   TONSILLECTOMY       OB History   No obstetric history on file.     Family History  Problem Relation Age of Onset   Alzheimer's disease Mother    Kidney disease Father    Ulcers Father    Gout Father    Breast cancer Paternal Grandmother    Colon cancer Neg Hx    Esophageal cancer Neg Hx    Rectal cancer Neg Hx    Stomach  cancer Neg Hx     Social History   Tobacco Use   Smoking status: Former   Smokeless tobacco: Never  Scientific laboratory technician Use: Never used  Substance Use Topics   Alcohol use: No    Alcohol/week: 0.0 standard drinks   Drug use: No    Home Medications Prior to Admission medications   Medication Sig Start Date End Date Taking? Authorizing Provider  buPROPion (WELLBUTRIN XL) 150 MG 24 hr tablet Take 300 mg by mouth daily.   Yes [provider]  clotrimazole (LOTRIMIN) 1 % cream Apply to affected area 2 times daily Patient taking differently: Apply 1 application topically daily as needed (infection). 02/02/20  Yes Couture, Cortni S, PA-C  docusate sodium (COLACE) 250 MG capsule Take 1 capsule (250 mg total) by mouth daily for 14 days. Stop of diarrhea develops 05/29/21 06/12/21 Yes Wynona Dove A, DO  escitalopram (LEXAPRO) 10 MG tablet Take 10 mg by mouth daily. 03/09/16  Yes [provider]  nitrofurantoin, macrocrystal-monohydrate, (MACROBID) 100 MG capsule Take 1 capsule (100 mg total) by mouth 2 (two) times daily for 5 days. 05/29/21 06/03/21 Yes Wynona Dove A, DO  propranolol ER (INDERAL LA) 80 MG 24 hr capsule Take 80 mg by mouth daily. 05/21/21  Yes [provider]  rosuvastatin (CRESTOR) 20 MG tablet Take 20  mg by mouth daily. 04/15/21  Yes [provider]  SUMAtriptan (IMITREX) 100 MG tablet Take 100 mg by mouth daily as needed for migraine. 02/20/21  Yes [provider]  topiramate (TOPAMAX) 100 MG tablet Take 100 mg by mouth daily. 05/17/21  Yes [provider]    Allergies    Amoxicillin, Butalbital-apap-caffeine, and Penicillins  Review of Systems   Review of Systems  Constitutional:  Negative for chills and fever.  HENT:  Negative for facial swelling and trouble swallowing.   Eyes:  Negative for photophobia and visual disturbance.  Respiratory:  Negative for cough and shortness of breath.   Cardiovascular:  Negative for chest  pain and palpitations.  Gastrointestinal:  Positive for abdominal pain, blood in stool and hematochezia. Negative for constipation, nausea, rectal pain and vomiting.  Endocrine: Negative for polydipsia and polyuria.  Genitourinary:  Positive for dysuria. Negative for difficulty urinating and hematuria.  Musculoskeletal:  Negative for gait problem and joint swelling.  Skin:  Negative for pallor and rash.  Neurological:  Negative for syncope and headaches.  Psychiatric/Behavioral:  Negative for agitation and confusion.    Physical Exam Updated Vital Signs BP 134/82   Pulse 65   Temp 98.7 F (37.1 C) (Oral)   Resp 18   LMP 03/28/2019   SpO2 96%   Physical Exam Vitals and nursing note reviewed. Exam conducted with a chaperone present.  Constitutional:      General: She is not in acute distress.    Appearance: Normal appearance.  HENT:     Head: Normocephalic and atraumatic.     Right Ear: External ear normal.     Left Ear: External ear normal.     Nose: Nose normal.     Mouth/Throat:     Mouth: Mucous membranes are moist.  Eyes:     General: No scleral icterus.       Right eye: No discharge.        Left eye: No discharge.  Cardiovascular:     Rate and Rhythm: Normal rate and regular rhythm.     Pulses: Normal pulses.     Heart sounds: Normal heart sounds.  Pulmonary:     Effort: Pulmonary effort is normal. No respiratory distress.     Breath sounds: Normal breath sounds.  Abdominal:     General: Abdomen is flat.     Tenderness: There is no abdominal tenderness.  Genitourinary:      Comments: No frank bleeding on rectal exam.  No melena.  No fissures or fistulas appreciated.  Question small hemorrhoid.  Musculoskeletal:        General: Normal range of motion.     Cervical back: Normal range of motion.     Right lower leg: No edema.     Left lower leg: No edema.  Skin:    General: Skin is warm and dry.     Capillary Refill: Capillary refill takes less than 2 seconds.      Coloration: Skin is not pale.  Neurological:     Mental Status: She is alert.  Psychiatric:        Mood and Affect: Mood normal.        Behavior: Behavior normal.    ED Results / Procedures / Treatments   Labs (all labs ordered are listed, but only abnormal results are displayed) Labs Reviewed  COMPREHENSIVE METABOLIC PANEL - Abnormal; Notable for the following components:      Result Value   Glucose, Bld 100 (*)  All other components within normal limits  URINALYSIS, ROUTINE W REFLEX MICROSCOPIC - Abnormal; Notable for the following components:   Color, Urine STRAW (*)    APPearance HAZY (*)    Hgb urine dipstick SMALL (*)    Leukocytes,Ua LARGE (*)    Bacteria, UA RARE (*)    All other components within normal limits  POC OCCULT BLOOD, ED - Abnormal; Notable for the following components:   Fecal Occult Bld POSITIVE (*)    All other components within normal limits  URINE CULTURE  CBC  LIPASE, BLOOD  HCG, SERUM, QUALITATIVE    EKG None  Radiology No results found.  Procedures Procedures   Medications Ordered in ED Medications  pantoprazole (PROTONIX) injection 40 mg (40 mg Intravenous Given 05/29/21 1843)    ED Course  I have reviewed the triage vital signs and the nursing notes.  Pertinent labs & imaging results that were available during my care of the patient were reviewed by me and considered in my medical decision making (see chart for details).    MDM Rules/Calculators/A&P                           This patient complains of blood in stool, dysuria; this involves an extensive number of treatment Options and is a complaint that carries with it a high risk of complications and Morbidity.  Vital signs reviewed and are stable.  She is nontoxic-appearing.  Abdominal exam is soft, nontender, nonperitoneal.  Serious etiologies considered.   Rectal exam with question of small hemorrhoid.  No melena on exam, no frank bleeding on exam.  Rectal tone is  intact.  Hemoglobin is stable.  Found to have urinary tract infection on urinalysis.  Urine culture sent.    There is no suspicion for pyonephritis.  Renal function is within normal notes. Start patient on oral Macrobid.  This is likely etiology of her dysuria and increased urinary urgency.  She does not appear to be septic.  Discussed supportive care including bland diet, stool softener.  Sitz baths.  Outpatient follow-up with PCP and gastroenterology regarding further care of hemorrhoid.    The patient improved significantly and was discharged in stable condition. Detailed discussions were had with the patient regarding current findings, and need for close f/u with PCP or on call doctor. The patient has been instructed to return immediately if the symptoms worsen in any way for re-evaluation. Patient verbalized understanding and is in agreement with current care plan. All questions answered prior to discharge.    Final Clinical Impression(s) / ED Diagnoses Final diagnoses:  Rectal bleeding  Hemorrhoids, unspecified hemorrhoid type  Urinary tract infection with hematuria, site unspecified    Rx / DC Orders ED Discharge Orders          Ordered    docusate sodium (COLACE) 250 MG capsule  Daily        05/29/21 1918    nitrofurantoin, macrocrystal-monohydrate, (MACROBID) 100 MG capsule  2 times daily        05/29/21 2241             Wynona Dove A, DO 05/29/21 2251

## 2021-05-29 NOTE — ED Triage Notes (Signed)
Pt reports an episode of rectal bleeding and abdominal pain today. Pt denies N/V/D.

## 2021-05-31 LAB — URINE CULTURE: Culture: >=100000 — AB

## 2021-06-04 ENCOUNTER — Other Ambulatory Visit: Payer: Self-pay | Admitting: Internal Medicine

## 2021-06-13 ENCOUNTER — Encounter: Payer: Self-pay | Admitting: Gastroenterology

## 2021-06-23 ENCOUNTER — Other Ambulatory Visit: Payer: Self-pay | Admitting: Internal Medicine

## 2021-06-23 DIAGNOSIS — Z853 Personal history of malignant neoplasm of breast: Secondary | ICD-10-CM

## 2021-06-24 ENCOUNTER — Other Ambulatory Visit: Payer: Self-pay | Admitting: Internal Medicine

## 2021-06-24 DIAGNOSIS — Z1231 Encounter for screening mammogram for malignant neoplasm of breast: Secondary | ICD-10-CM

## 2021-06-24 DIAGNOSIS — Z853 Personal history of malignant neoplasm of breast: Secondary | ICD-10-CM

## 2021-07-01 ENCOUNTER — Other Ambulatory Visit: Payer: Self-pay

## 2021-07-01 ENCOUNTER — Ambulatory Visit
Admission: RE | Admit: 2021-07-01 | Discharge: 2021-07-01 | Disposition: A | Discharge status: Home / Self Care | Payer: Medicare Other | Source: Ambulatory Visit | Attending: Internal Medicine | Admitting: Internal Medicine

## 2021-07-01 DIAGNOSIS — Z853 Personal history of malignant neoplasm of breast: Secondary | ICD-10-CM

## 2021-07-01 DIAGNOSIS — Z1231 Encounter for screening mammogram for malignant neoplasm of breast: Secondary | ICD-10-CM

## 2021-07-06 ENCOUNTER — Other Ambulatory Visit: Payer: Self-pay | Admitting: Internal Medicine

## 2021-07-06 DIAGNOSIS — R928 Other abnormal and inconclusive findings on diagnostic imaging of breast: Secondary | ICD-10-CM

## 2021-07-06 DIAGNOSIS — R921 Mammographic calcification found on diagnostic imaging of breast: Secondary | ICD-10-CM

## 2021-07-14 ENCOUNTER — Ambulatory Visit: Payer: Medicare Other | Admitting: Gastroenterology

## 2021-07-16 ENCOUNTER — Ambulatory Visit
Admission: RE | Admit: 2021-07-16 | Discharge: 2021-07-16 | Disposition: A | Discharge status: Home / Self Care | Payer: Medicare Other | Source: Ambulatory Visit | Attending: Internal Medicine | Admitting: Internal Medicine

## 2021-07-16 ENCOUNTER — Other Ambulatory Visit: Payer: Self-pay

## 2021-07-16 DIAGNOSIS — R928 Other abnormal and inconclusive findings on diagnostic imaging of breast: Secondary | ICD-10-CM | POA: Insufficient documentation

## 2021-07-16 DIAGNOSIS — R921 Mammographic calcification found on diagnostic imaging of breast: Secondary | ICD-10-CM

## 2021-07-16 HISTORY — PX: BREAST BIOPSY: SHX20

## 2021-07-20 ENCOUNTER — Other Ambulatory Visit: Payer: Self-pay | Admitting: Internal Medicine

## 2021-07-20 DIAGNOSIS — R921 Mammographic calcification found on diagnostic imaging of breast: Secondary | ICD-10-CM

## 2021-07-20 DIAGNOSIS — R928 Other abnormal and inconclusive findings on diagnostic imaging of breast: Secondary | ICD-10-CM

## 2021-07-20 LAB — SURGICAL PATHOLOGY

## 2021-07-29 ENCOUNTER — Ambulatory Visit
Admission: RE | Admit: 2021-07-29 | Discharge: 2021-07-29 | Disposition: A | Discharge status: Home / Self Care | Payer: Medicare Other | Source: Ambulatory Visit | Attending: Internal Medicine | Admitting: Internal Medicine

## 2021-07-29 ENCOUNTER — Other Ambulatory Visit: Payer: Self-pay

## 2021-07-29 DIAGNOSIS — R921 Mammographic calcification found on diagnostic imaging of breast: Secondary | ICD-10-CM

## 2021-07-29 DIAGNOSIS — R928 Other abnormal and inconclusive findings on diagnostic imaging of breast: Secondary | ICD-10-CM | POA: Insufficient documentation

## 2021-07-29 HISTORY — PX: BREAST BIOPSY: SHX20

## 2021-07-30 LAB — SURGICAL PATHOLOGY

## 2021-08-02 DIAGNOSIS — C50919 Malignant neoplasm of unspecified site of unspecified female breast: Secondary | ICD-10-CM

## 2021-08-04 NOTE — Progress Notes (Signed)
Contacted patient who requested I speak with Bianca Fletcher her [facility] regarding appointments/treatment plan.  Left message for Ms. Ward that patient is scheduled to see Dr. Grayland Ormond on 08/10/21 at 3:00.

## 2021-08-04 NOTE — Progress Notes (Signed)
Spoke to [facility] about Surgical consult for patient. She will be scheduled at [facility] in [place].  Working with Lattie Haw referral coordinator to schedule consult.

## 2021-08-05 DIAGNOSIS — D0512 Intraductal carcinoma in situ of left breast: Secondary | ICD-10-CM | POA: Insufficient documentation

## 2021-08-05 NOTE — Progress Notes (Signed)
Bianca Fletcher  [phone] [phone]) [phone]  ID: Bianca Fletcher OB: [birth year]  MR#: [id number]  JJH#:[id number]  Patient Care Team: Haywood Pao, MD as PCP - General (Internal Medicine)  CHIEF COMPLAINT: DCIS, left breast.  INTERVAL HISTORY: Patient is a 55 year old female who was noted to have an abnormality on screening mammogram.  Subsequent ultrasound biopsy revealed the above-stated diagnosis.  She currently feels well and is asymptomatic.  She has no neurologic complaints.  She denies any recent fevers or illnesses.  She has a good appetite and denies weight loss.  She has no chest pain, shortness of breath, cough, or hemoptysis.  She denies any nausea, vomiting, constipation, or diarrhea.  She has no urinary complaints.  Patient feels at her baseline offers no specific complaints today.  REVIEW OF SYSTEMS:   Review of Systems  Constitutional: Negative.  Negative for fever, malaise/fatigue and weight loss.  Respiratory: Negative.  Negative for cough, hemoptysis and shortness of breath.   Cardiovascular: Negative.  Negative for chest pain and leg swelling.  Gastrointestinal: Negative.  Negative for abdominal pain.  Genitourinary: Negative.  Negative for dysuria.  Musculoskeletal: Negative.  Negative for back pain.  Skin: Negative.  Negative for rash.  Neurological: Negative.  Negative for dizziness, focal weakness, weakness and headaches.  Psychiatric/Behavioral: Negative.  The patient is not nervous/anxious.    As per HPI. Otherwise, a complete review of systems is negative.  PAST MEDICAL HISTORY: Past Medical History:  Diagnosis Date   Breast cancer [facility]) 2003   right breast   Cancer ([place]) 2003   right breast-no other tx.   Depression    Migraine     PAST SURGICAL HISTORY: Past Surgical History:  Procedure Laterality Date   BREAST BIOPSY Left 07/16/2021   stereo bx,x-clip, path pending   BREAST BIOPSY Left 07/29/2021   stereo bx,  ribbon clip, path pending   BREAST SURGERY     MASTECTOMY Right 2003   positive   TONSILLECTOMY      FAMILY HISTORY: Family History  Problem Relation Age of Onset   Alzheimer's disease Mother    Kidney disease Father    Ulcers Father    Gout Father    Breast cancer Paternal Grandmother    Colon cancer Neg Hx    Esophageal cancer Neg Hx    Rectal cancer Neg Hx    Stomach cancer Neg Hx     ADVANCED DIRECTIVES (Y/N):  N  HEALTH MAINTENANCE: Social History   Tobacco Use   Smoking status: Former   Smokeless tobacco: Never  Scientific laboratory technician Use: Never used  Substance Use Topics   Alcohol use: No    Alcohol/week: 0.0 standard drinks   Drug use: No     Colonoscopy:  PAP:  Bone density:  Lipid panel:  Allergies  Allergen Reactions   Amoxicillin Other (See Comments)   Butalbital-Apap-Caffeine Other (See Comments)   Penicillins Rash    Current Outpatient Medications  Medication Sig Dispense Refill   buPROPion (WELLBUTRIN XL) 150 MG 24 hr tablet Take 300 mg by mouth daily.     clotrimazole (LOTRIMIN) 1 % cream Apply to affected area 2 times daily (Patient taking differently: Apply 1 application topically daily as needed (infection).) 15 g 0   escitalopram (LEXAPRO) 10 MG tablet Take 10 mg by mouth daily.     propranolol ER (INDERAL LA) 80 MG 24 hr capsule Take 80 mg by mouth daily.     rosuvastatin (CRESTOR)  20 MG tablet Take 20 mg by mouth daily.     SUMAtriptan (IMITREX) 100 MG tablet Take 100 mg by mouth daily as needed for migraine.     topiramate (TOPAMAX) 100 MG tablet Take 100 mg by mouth daily.     Current Facility-Administered Medications  Medication Dose Route Frequency Provider Last Rate Last Admin   0.9 %  sodium chloride infusion  500 mL Intravenous Once Armbruster, Carlota Raspberry, MD        OBJECTIVE: Vitals:   08/10/21 1431  BP: 119/80  Pulse: 76  Resp: 20  Temp: 97.8 F (36.6 C)  SpO2: 97%     Body mass index is 44.81 kg/m.    ECOG FS:0 -  Asymptomatic  General: Well-developed, well-nourished, no acute distress. Eyes: Pink conjunctiva, anicteric sclera. HEENT: Normocephalic, moist mucous membranes. Breast: Exam deferred today. Lungs: No audible wheezing or coughing. Heart: Regular rate and rhythm. Abdomen: Soft, nontender, no obvious distention. Musculoskeletal: No edema, cyanosis, or clubbing. Neuro: Alert, answering all questions appropriately. Cranial nerves grossly intact. Skin: No rashes or petechiae noted. Psych: Normal affect. Lymphatics: No cervical, calvicular, axillary or inguinal LAD.   LAB RESULTS:  Lab Results  Component Value Date   NA 139 05/29/2021   K 3.9 05/29/2021   CL 108 05/29/2021   CO2 25 05/29/2021   GLUCOSE 100 (H) 05/29/2021   BUN 15 05/29/2021   CREATININE 0.84 05/29/2021   CALCIUM 9.0 05/29/2021   PROT 7.1 05/29/2021   ALBUMIN 3.8 05/29/2021   AST 23 05/29/2021   ALT 25 05/29/2021   ALKPHOS 95 05/29/2021   BILITOT 0.6 05/29/2021   GFRNONAA >60 05/29/2021    Lab Results  Component Value Date   WBC 9.0 05/29/2021   HGB 13.0 05/29/2021   HCT 41.2 05/29/2021   MCV 88.6 05/29/2021   PLT 279 05/29/2021     STUDIES: MM CLIP PLACEMENT LEFT  Result Date: 07/29/2021 CLINICAL DATA:  Postprocedure mammogram. EXAM: 3D DIAGNOSTIC LEFT MAMMOGRAM POST STEREOTACTIC BIOPSY COMPARISON:  Previous exam(s). FINDINGS: 3D Mammographic images were obtained following stereotactic guided biopsy of calcifications in the upper outer left breast at middle to posterior depth. The biopsy marking clip is in expected position at the site of biopsy. The X shaped clip, denoting site of biopsy-proven DCIS in the upper outer breast at posterior depth, is located 3.4 cm posterior to today's biopsy site. IMPRESSION: Appropriate positioning of the ribbon shaped biopsy marking clip at the site of biopsy in the upper outer left breast at middle to posterior depth. Final Assessment: Post Procedure Mammograms for  Marker Placement Electronically Signed   By: Ileana Roup M.D.   On: 07/29/2021 13:43  MM CLIP PLACEMENT LEFT  Result Date: 07/16/2021 CLINICAL DATA:  Evaluate biopsy marker EXAM: 3D DIAGNOSTIC LEFT MAMMOGRAM POST ULTRASOUND BIOPSY COMPARISON:  Previous exam(s). FINDINGS: 3D Mammographic images were obtained following stereotactic guided biopsy of left breast calcifications. The biopsy marking clip is in expected position at the site of biopsy. IMPRESSION: Appropriate positioning of the X shaped biopsy marking clip at the site of biopsy in the location of the biopsied calcifications. Final Assessment: Post Procedure Mammograms for Marker Placement Electronically Signed   By: Dorise Bullion III M.D.   On: 07/16/2021 13:39  MM LT BREAST BX W LOC DEV 1ST LESION IMAGE BX SPEC STEREO GUIDE  Addendum Date: 08/04/2021   ADDENDUM REPORT: 08/04/2021 08:02 ADDENDUM: PATHOLOGY revealed: A. LEFT BREAST, UPPER OUTER QUADRANT CALCIFICATIONS; STEREOTACTIC BIOPSY: - BENIGN BREAST TISSUE WITH  FIBROCYSTIC CHANGES AND SCLEROSING ADENOSIS. - CALCIFICATIONS ARE NOTED IN ASSOCIATION WITH BENIGN BREAST TISSUE. - NEGATIVE FOR ATYPIA AND MALIGNANCY. Comment: Deeper levels were examined (blocks A1, A2, A3, A4, A6). Pathology results are CONCORDANT with imaging findings, per Dr. Ileana Roup. Pathology results and recommendations were discussed with patient via telephone on 08/02/2021. Patient reported biopsy site doing well with no adverse symptoms, and only slight tenderness at the site. Post biopsy care instructions were reviewed, questions were answered and my direct phone number was provided. Patient was instructed to call [facility] for any additional questions or concerns related to biopsy site. RECOMMENDATIONS: Surgical consultation for recently diagnosed LEFT breast cancer has been arranged for patient by Al Pimple RN at [facility]. Pathology results reported by Electa Sniff RN on 08/02/2021.  Electronically Signed   By: Ileana Roup M.D.   On: 08/04/2021 08:02   Result Date: 08/04/2021 CLINICAL DATA:  Calcifications in the upper-outer left breast. Biopsy of calcifications located more posteriorly in the upper outer left breast, sampled on 07/16/2021, demonstrate DCIS. Additional biopsy of more anteriorly located calcifications today to evaluate for extent of disease. EXAM: LEFT BREAST STEREOTACTIC CORE NEEDLE BIOPSY COMPARISON:  Previous exams. FINDINGS: The patient and I discussed the procedure of stereotactic-guided biopsy including benefits and alternatives. We discussed the high likelihood of a successful procedure. We discussed the risks of the procedure including infection, bleeding, tissue injury, clip migration, and inadequate sampling. Informed written consent was given. The usual time out protocol was performed immediately prior to the procedure. Using sterile technique and 1% Lidocaine as local anesthetic, under stereotactic guidance, a 9 gauge vacuum assisted device was used to perform core needle biopsy of calcifications in the upper-outer quadrant of the left breast at middle to posterior depth using a superior approach. Specimen radiograph was performed showing representative calcifications. Specimens with calcifications are identified for pathology. Lesion quadrant: Upper outer quadrant At the conclusion of the procedure, a ribbon shaped tissue marker clip was deployed into the biopsy cavity. Follow-up 2-view mammogram was performed and dictated separately. IMPRESSION: Stereotactic-guided biopsy of calcifications in the upper outer left breast at middle to posterior depth. No apparent complications. Electronically Signed: By: Ileana Roup M.D. On: 07/29/2021 13:51   MM LT BREAST BX W LOC DEV 1ST LESION IMAGE BX SPEC STEREO GUIDE  Addendum Date: 07/19/2021   ADDENDUM REPORT: 07/19/2021 13:08 ADDENDUM: PATHOLOGY revealed: A. LEFT BREAST, UPPER OUTER CALCIFICATIONS; STEREOTACTIC  BIOPSY: - DUCTAL CARCINOMA IN SITU, INTERMEDIATE GRADE, WITH CALCIFICATIONS AND NECROSIS. Pathology results are CONCORDANT with imaging findings, per Dr. Dorise Bullion. Pathology results and recommendations were discussed with patient via telephone on 07/19/2021. Patient reported biopsy site doing well with no adverse symptoms, and only slight tenderness at the site. Post biopsy care instructions were reviewed, questions were answered and my direct phone number was provided. Patient was instructed to call [facility] for any additional questions or concerns related to biopsy site. RECOMMENDATIONS: 1. Please schedule patient for LEFT breast stereotactic guided biopsy of an additional anterior group of calcifications noted on mammogram. 2. Please schedule surgical consultation for after second LEFT breast biopsy. Request for surgical consultation relayed to Al Pimple RN at [facility] by Electa Sniff RN on 07/19/2021. Pathology results reported by Electa Sniff RN on 07/19/2021. Electronically Signed   By: Margarette Canada M.D.   On: 07/19/2021 13:08   Result Date: 07/19/2021 CLINICAL DATA:  Biopsy of left breast calcifications EXAM: LEFT BREAST STEREOTACTIC  CORE NEEDLE BIOPSY COMPARISON:  Previous exams. FINDINGS: The patient and I discussed the procedure of stereotactic-guided biopsy including benefits and alternatives. We discussed the high likelihood of a successful procedure. We discussed the risks of the procedure including infection, bleeding, tissue injury, clip migration, and inadequate sampling. Informed written consent was given. The usual time out protocol was performed immediately prior to the procedure. Using sterile technique and 1% Lidocaine as local anesthetic, under stereotactic guidance, a 9 gauge vacuum assisted device was used to perform core needle biopsy of calcifications in the upper-outer left breast using a lateral approach. Specimen radiograph was performed showing  calcifications in several core specimens. Specimens with calcifications are identified for pathology. Lesion quadrant: Upper outer left breast At the conclusion of the procedure, an X shaped tissue marker clip was deployed into the biopsy cavity. Follow-up 2-view mammogram was performed and dictated separately. IMPRESSION: Stereotactic-guided biopsy of left breast calcifications. No apparent complications. Electronically Signed: By: Dorise Bullion III M.D. On: 07/16/2021 13:37   ASSESSMENT: DCIS, left breast.  PLAN:    DCIS, left breast: Biopsy results from July 16, 2021 did not reveal invasive component.  Patient has an appointment with surgery on Friday, August 13, 2021 with Dr. Barry Dienes.  We discussed both lumpectomy followed by adjuvant XRT or possibly total mastectomy without adjuvant XRT.  Patient underwent right mastectomy in 2003 and is leaning towards left mastectomy as well.  Either way, patient will require tamoxifen for a total of 5 years. History of breast cancer: Unclear stage or if any adjuvant treatment was received.  Patient underwent right total mastectomy in 2003. Genetic testing: Patient may benefit from genetic testing in the near future. Disposition: Patient and her caretaker both live in [place] and have requested all of their follow-up care close to home.  Her surgical appointment this Friday is in [place].  If she requires additional medical oncology follow-up, this can be arranged at the cancer center at [facility].  I spent a total of 60 minutes reviewing chart data, face-to-face evaluation with the patient, counseling and coordination of care as detailed above.   Patient expressed understanding and was in agreement with this plan. She also understands that She can call clinic at any time with any questions, concerns, or complaints.   Cancer Staging Ductal carcinoma in situ (DCIS) of left breast Staging form: Breast, AJCC 8th Edition - Clinical stage  from 08/10/2021: Stage 0 (cTis (DCIS), cN0, cM0, ER: Not Assessed, PR: Not Assessed, HER2: Not Assessed) - Signed by Lloyd Huger, MD on 08/10/2021 Stage prefix: Initial diagnosis  Lloyd Huger, MD   08/10/2021 6:43 PM

## 2021-08-10 ENCOUNTER — Inpatient Hospital Stay: Payer: Medicare Other

## 2021-08-10 ENCOUNTER — Inpatient Hospital Stay: Payer: Medicare Other | Attending: Oncology | Admitting: Oncology

## 2021-08-10 ENCOUNTER — Encounter: Payer: Self-pay | Admitting: Oncology

## 2021-08-10 ENCOUNTER — Other Ambulatory Visit: Payer: Self-pay

## 2021-08-10 DIAGNOSIS — Z9011 Acquired absence of right breast and nipple: Secondary | ICD-10-CM | POA: Insufficient documentation

## 2021-08-10 DIAGNOSIS — D0512 Intraductal carcinoma in situ of left breast: Secondary | ICD-10-CM | POA: Diagnosis not present

## 2021-08-10 DIAGNOSIS — Z853 Personal history of malignant neoplasm of breast: Secondary | ICD-10-CM | POA: Insufficient documentation

## 2021-08-10 DIAGNOSIS — Z87891 Personal history of nicotine dependence: Secondary | ICD-10-CM | POA: Insufficient documentation

## 2021-08-13 ENCOUNTER — Telehealth: Payer: Self-pay | Admitting: Hematology and Oncology

## 2021-08-13 ENCOUNTER — Encounter: Payer: Self-pay | Admitting: *Deleted

## 2021-08-13 DIAGNOSIS — C50412 Malignant neoplasm of upper-outer quadrant of left female breast: Secondary | ICD-10-CM | POA: Insufficient documentation

## 2021-08-13 DIAGNOSIS — C50919 Malignant neoplasm of unspecified site of unspecified female breast: Secondary | ICD-10-CM

## 2021-08-13 DIAGNOSIS — M25512 Pain in left shoulder: Secondary | ICD-10-CM | POA: Insufficient documentation

## 2021-08-13 NOTE — Telephone Encounter (Signed)
Scheduled appt per 11/10 staff msg. Called Bianca Fletcher, pt's caretaker, no answer. Left msg with appt date and time and also requested that she call me back to confirm the appt. I left my direct number for her to call back.

## 2021-08-17 NOTE — Progress Notes (Signed)
[place] CONSULT NOTE  Patient Care Team: Tisovec, Fransico Him, MD as PCP - General (Internal Medicine) Mauro Kaufmann, RN as Oncology Nurse Navigator Rockwell Germany, RN as Oncology Nurse Navigator  CHIEF COMPLAINTS/PURPOSE OF CONSULTATION:  Newly diagnosed breast cancer  HISTORY OF PRESENTING ILLNESS:  Bianca Fletcher 55 y.o. female is here because of recent diagnosis of calcifications of the left breast. Screening mammogram on 04/29/2020 showed calcifications in the left breast. Diagnostic mammogram and [place] on 07/01/2021 showed upper outer quadrant indeterminate calcifications in the left breast. She presents to the clinic today for initial evaluation and discussion of treatment options.  Patient is here today accompanied by her power of attorney.  She lives independently at her home.  Her major complaint is left upper arm pain and discomfort.  She also thinks that is slightly swollen.  I reviewed her records extensively and collaborated the history with the patient.  SUMMARY OF ONCOLOGIC HISTORY: Oncology History  Ductal carcinoma in situ (DCIS) of left breast  07/16/2021 Initial Diagnosis   Screening mammogram detected left breast calcifications measuring 3.4 cm  Stereotactic biopsy of Left breast UOQ calcifications: Intermediate grade DCIS with calcifications and necrosis ER 90 to 100% strong staining  Additional left breast calcifications; stereotactic biopsy: Benign breast tissue with fibrocystic and sclerosing adenosis   08/10/2021 Cancer Staging   Staging form: Breast, AJCC 8th Edition - Clinical stage from 08/10/2021: Stage 0 (cTis (DCIS), cN0, cM0, ER+, PR: Not Assessed, HER2: Not Assessed) - Signed by Nicholas Lose, MD on 08/18/2021 Stage prefix: Initial diagnosis      MEDICAL HISTORY:  Past Medical History:  Diagnosis Date   Breast cancer ([place]) 2003   right breast   Cancer ([place]) 2003   right breast-no other tx.   Depression    Migraine      SURGICAL HISTORY: Past Surgical History:  Procedure Laterality Date   BREAST BIOPSY Left 07/16/2021   stereo bx,x-clip, path pending   BREAST BIOPSY Left 07/29/2021   stereo bx, ribbon clip, path pending   BREAST SURGERY     MASTECTOMY Right 2003   positive   TONSILLECTOMY      SOCIAL HISTORY: Social History   Socioeconomic History   Marital status: Divorced    Spouse name: Not on file   Number of children: Not on file   Years of education: Not on file   Highest education level: Not on file  Occupational History   Not on file  Tobacco Use   Smoking status: Former   Smokeless tobacco: Never  Vaping Use   Vaping Use: Never used  Substance and Sexual Activity   Alcohol use: No    Alcohol/week: 0.0 standard drinks   Drug use: No   Sexual activity: Not Currently    Birth control/protection: None  Other Topics Concern   Not on file  Social History Narrative   Not on file   Social Determinants of Health   Financial Resource Strain: Not on file  Food Insecurity: Not on file  Transportation Needs: Not on file  Physical Activity: Not on file  Stress: Not on file  Social Connections: Not on file  Intimate Partner Violence: Not on file    FAMILY HISTORY: Family History  Problem Relation Age of Onset   Alzheimer's disease Mother    Kidney disease Father    Ulcers Father    Gout Father    Breast cancer Paternal Grandmother    Colon cancer Neg Hx  Esophageal cancer Neg Hx    Rectal cancer Neg Hx    Stomach cancer Neg Hx     ALLERGIES:  is allergic to amoxicillin, butalbital-apap-caffeine, and penicillins.  MEDICATIONS:  Current Outpatient Medications  Medication Sig Dispense Refill   buPROPion (WELLBUTRIN XL) 150 MG 24 hr tablet Take 300 mg by mouth daily.     clotrimazole (LOTRIMIN) 1 % cream Apply to affected area 2 times daily (Patient taking differently: Apply 1 application topically daily as needed (infection).) 15 g 0   escitalopram (LEXAPRO) 10  MG tablet Take 10 mg by mouth daily.     propranolol ER (INDERAL LA) 80 MG 24 hr capsule Take 80 mg by mouth daily.     rosuvastatin (CRESTOR) 20 MG tablet Take 20 mg by mouth daily.     SUMAtriptan (IMITREX) 100 MG tablet Take 100 mg by mouth daily as needed for migraine.     topiramate (TOPAMAX) 100 MG tablet Take 100 mg by mouth daily.     Current Facility-Administered Medications  Medication Dose Route Frequency Provider Last Rate Last Admin   0.9 %  sodium chloride infusion  500 mL Intravenous Once Armbruster, Carlota Raspberry, MD        REVIEW OF SYSTEMS:   Constitutional: Denies fevers, chills or abnormal night sweats   Breast:  Denies any palpable lumps or discharge Left arm slightly swollen All other systems were reviewed with the patient and are negative.  PHYSICAL EXAMINATION: ECOG PERFORMANCE STATUS: 1 - Symptomatic but completely ambulatory  Vitals:   08/18/21 1540  BP: (!) 128/54  Pulse: 71  Resp: 18  Temp: 98.1 F (36.7 C)  SpO2: 97%   Filed Weights   08/18/21 1540  Weight: 267 lb 3.2 oz (121.2 kg)      LABORATORY DATA:  I have reviewed the data as listed Lab Results  Component Value Date   WBC 9.0 05/29/2021   HGB 13.0 05/29/2021   HCT 41.2 05/29/2021   MCV 88.6 05/29/2021   PLT 279 05/29/2021   Lab Results  Component Value Date   NA 139 05/29/2021   K 3.9 05/29/2021   CL 108 05/29/2021   CO2 25 05/29/2021    RADIOGRAPHIC STUDIES: I have personally reviewed the radiological reports and agreed with the findings in the report.  ASSESSMENT AND PLAN:  Ductal carcinoma in situ (DCIS) of left breast 07/16/2021: Screening mammogram detected left breast calcifications measuring 3.4 cm  Stereotactic biopsy of Left breast UOQ calcifications: Intermediate grade DCIS with calcifications and necrosis ER 90 to 100% strong staining  Additional left breast calcifications; stereotactic biopsy: Benign breast tissue with fibrocystic and sclerosing  adenosis  Pathology review: I discussed with the patient the difference between DCIS and invasive breast cancer. It is considered a precancerous lesion. DCIS is classified as a 0. It is generally detected through mammograms as calcifications. We discussed the significance of grades and its impact on prognosis. We also discussed the importance of ER and PR receptors and their implications to adjuvant treatment options. Prognosis of DCIS dependence on grade, comedo necrosis. It is anticipated that if not treated, 20-30% of DCIS can develop into invasive breast cancer.  Recommendation: 1. Breast conserving surgery versus mastectomy (Patient had right mastectomy about 20 years ago) 2. if she undergoes lumpectomy and she will need adjuvant radiation therapy 3. Followed by antiestrogen therapy with tamoxifen 5 years   Patient is leaning on doing left mastectomy so that she can be more symmetric. She will need  some assistance with taking care of the postmastectomy care. Her power of attorney accompanying her today and felt that they can help her with some of this post op care.  Return to clinic after surgery to discuss the final pathology report     All questions were answered. The patient knows to call the clinic with any problems, questions or concerns.   Rulon Eisenmenger, MD, MPH 08/18/2021    I, Thana Ates, am acting as scribe for Nicholas Lose, MD.  I have reviewed the above documentation for accuracy and completeness, and I agree with the above.

## 2021-08-18 ENCOUNTER — Other Ambulatory Visit: Payer: Self-pay

## 2021-08-18 ENCOUNTER — Inpatient Hospital Stay: Payer: Medicare Other

## 2021-08-18 ENCOUNTER — Telehealth: Payer: Self-pay | Admitting: Radiation Oncology

## 2021-08-18 ENCOUNTER — Inpatient Hospital Stay: Payer: Medicare Other | Attending: Hematology and Oncology | Admitting: Hematology and Oncology

## 2021-08-18 DIAGNOSIS — D0512 Intraductal carcinoma in situ of left breast: Secondary | ICD-10-CM | POA: Diagnosis present

## 2021-08-18 NOTE — Assessment & Plan Note (Signed)
07/16/2021: Screening mammogram detected left breast calcifications measuring 3.4 cm  Stereotactic biopsy of Left breast UOQ calcifications: Intermediate grade DCIS with calcifications and necrosis ER 90 to 100% strong staining  Additional left breast calcifications; stereotactic biopsy: Benign breast tissue with fibrocystic and sclerosing adenosis  Pathology review: I discussed with the patient the difference between DCIS and invasive breast cancer. It is considered a precancerous lesion. DCIS is classified as a 0. It is generally detected through mammograms as calcifications. We discussed the significance of grades and its impact on prognosis. We also discussed the importance of ER and PR receptors and their implications to adjuvant treatment options. Prognosis of DCIS dependence on grade, comedo necrosis. It is anticipated that if not treated, 20-30% of DCIS can develop into invasive breast cancer.  Recommendation: 1. Breast conserving surgery versus [place] clinical trial participation 2. Followed by adjuvant radiation therapy 3. Followed by antiestrogen therapy with tamoxifen 5 years  Tamoxifen counseling: We discussed the risks and benefits of tamoxifen. These include but not limited to insomnia, hot flashes, mood changes, vaginal dryness, and weight gain. Although rare, serious side effects including endometrial cancer, risk of blood clots were also discussed. We strongly believe that the benefits far outweigh the risks. Patient understands these risks and consented to starting treatment. Planned treatment duration is 5 years.  Return to clinic after surgery to discuss the final pathology report and come up with an adjuvant treatment plan.

## 2021-08-18 NOTE — Telephone Encounter (Signed)
Called patient to schedule consultation with Dr. Squire. No answer, LVM for return call. 

## 2021-08-19 ENCOUNTER — Other Ambulatory Visit: Payer: Self-pay | Admitting: General Surgery

## 2021-08-19 ENCOUNTER — Encounter: Payer: Self-pay | Admitting: *Deleted

## 2021-08-23 ENCOUNTER — Encounter: Payer: Self-pay | Admitting: *Deleted

## 2021-08-24 NOTE — Progress Notes (Incomplete)
Radiation Oncology         [phone] ________________________________  Initial Outpatient Consultation  Name: Bianca Fletcher MRN: [id number]  Date: 08/25/2021  DOB: 11-12-1965  KC:[id number], Fransico Him, MD  Stark Klein, MD   REFERRING PHYSICIAN: Stark Klein, MD  DIAGNOSIS: No diagnosis found.  Stage 0 (cTis (DCIS), cN0, cM0) Left Breast Ductal Carcinoma in-situ, ER+ / PR not assessed / Her2 not assessed,   CHIEF COMPLAINT: Here to discuss management of left breast DCIS  HISTORY OF PRESENT ILLNESS::Bianca Fletcher is a 55 y.o. female who presented with left breast abnormality on the following imaging: unilateral left screening mammogram on the date of 04/29/2020.  No symptoms, if any, were reported at that time, though the patient has a history of right breast cancer. Diagnostic mammogram and ultrasound on 07/01/2021 showed upper outer quadrant indeterminate calcifications in the left breast measuring 3.4 cm in the greatest AP dimension.   Left breast UOQ biopsy of the calcifications on date of 07/16/21 showed intermediate grade DCIS with calcifications and necrosis.  ER status: positive; PR status: not assessed; Her2 status: not assessed. No lymph nodes were biopsied  Additional left breast biopsy collected on 07/29/21 revealed benign breast tissue with fibrocystic changes and sclerosing adenosis.   The patient was then referred to Dr. Barry Dienes to discuss surgical options. During this visit, the patient voiced concern regarding cost of treatment. (Dr. Barry Dienes will look into if she is a candidate for the COMET trial). Dr. Barry Dienes recommended treatment consisting of lumpectomy and radiation for ease of recovery over mastectomy. Of note: The patient reported current swelling in her left shoulder, pain and decreased ROM during this visit.    The patient wishes to proceed with left mastectomy, and is scheduled for surgery on 09/09/21 (with SLN biopsies).  (The patient also met with Dr. Lindi Adie on  08/18/21 to discuss further treatment options. Dr. Lindi Adie will have the patient return after her mastectomy to discuss further treatment pending pathology).   Of note: the patient has a history of right breast cancer and underwent right mastectomy by Dr. Margot Chimes.  PREVIOUS RADIATION THERAPY: {EXAM; YES/NO:19492::"No"}  PAST MEDICAL HISTORY:  has a past medical history of Breast cancer ([place]) (2003), Cancer ([place]) (2003), Depression, and Migraine.    PAST SURGICAL HISTORY: Past Surgical History:  Procedure Laterality Date   BREAST BIOPSY Left 07/16/2021   stereo bx,x-clip, path pending   BREAST BIOPSY Left 07/29/2021   stereo bx, ribbon clip, path pending   BREAST SURGERY     MASTECTOMY Right 2003   positive   TONSILLECTOMY      FAMILY HISTORY: family history includes Alzheimer's disease in her mother; Breast cancer in her paternal grandmother; Gout in her father; Kidney disease in her father; Ulcers in her father.  SOCIAL HISTORY:  reports that she has quit smoking. She has never used smokeless tobacco. She reports that she does not drink alcohol and does not use drugs.  ALLERGIES: Amoxicillin, Butalbital-apap-caffeine, and Penicillins  MEDICATIONS:  Current Outpatient Medications  Medication Sig Dispense Refill   buPROPion (WELLBUTRIN XL) 150 MG 24 hr tablet Take 300 mg by mouth daily.     clotrimazole (LOTRIMIN) 1 % cream Apply to affected area 2 times daily (Patient taking differently: Apply 1 application topically daily as needed (infection).) 15 g 0   escitalopram (LEXAPRO) 10 MG tablet Take 10 mg by mouth daily.     propranolol ER (INDERAL LA) 80 MG 24 hr capsule Take 80 mg by mouth  daily.     rosuvastatin (CRESTOR) 20 MG tablet Take 20 mg by mouth daily.     SUMAtriptan (IMITREX) 100 MG tablet Take 100 mg by mouth daily as needed for migraine.     topiramate (TOPAMAX) 100 MG tablet Take 100 mg by mouth daily.     Current Facility-Administered Medications  Medication Dose  Route Frequency Provider Last Rate Last Admin   0.9 %  sodium chloride infusion  500 mL Intravenous Once Armbruster, Carlota Raspberry, MD        REVIEW OF SYSTEMS: As above in HPI.   PHYSICAL EXAM:  vitals were not taken for this visit.   General: Alert and oriented, in no acute distress HEENT: Head is normocephalic. Extraocular movements are intact. Oropharynx is clear. Neck: Neck is supple, no palpable cervical or supraclavicular lymphadenopathy. Heart: Regular in rate and rhythm with no murmurs, rubs, or gallops. Chest: Clear to auscultation bilaterally, with no rhonchi, wheezes, or rales. Abdomen: Soft, nontender, nondistended, with no rigidity or guarding. Extremities: No cyanosis or edema. Lymphatics: see Neck Exam Skin: No concerning lesions. Musculoskeletal: symmetric strength and muscle tone throughout. Neurologic: Cranial nerves II through XII are grossly intact. No obvious focalities. Speech is fluent. Coordination is intact. Psychiatric: Judgment and insight are intact. Affect is appropriate. Breasts: *** . No other palpable masses appreciated in the breasts or axillae *** .    ECOG = ***  0 - Asymptomatic (Fully active, able to carry on all predisease activities without restriction)  1 - Symptomatic but completely ambulatory (Restricted in physically strenuous activity but ambulatory and able to carry out work of a light or sedentary nature. For example, light housework, office work)  2 - Symptomatic, <50% in bed during the day (Ambulatory and capable of all self care but unable to carry out any work activities. Up and about more than 50% of waking hours)  3 - Symptomatic, >50% in bed, but not bedbound (Capable of only limited self-care, confined to bed or chair 50% or more of waking hours)  4 - Bedbound (Completely disabled. Cannot carry on any self-care. Totally confined to bed or chair)  5 - Death   Eustace Pen MM, Creech RH, Tormey DC, et al. [phone]). "Toxicity and response  criteria of the [facility]". [place] Oncol. 5 (6): 649-55   LABORATORY DATA:  Lab Results  Component Value Date   WBC 9.0 05/29/2021   HGB 13.0 05/29/2021   HCT 41.2 05/29/2021   MCV 88.6 05/29/2021   PLT 279 05/29/2021   CMP     Component Value Date/Time   NA 139 05/29/2021 1755   K 3.9 05/29/2021 1755   CL 108 05/29/2021 1755   CO2 25 05/29/2021 1755   GLUCOSE 100 (H) 05/29/2021 1755   BUN 15 05/29/2021 1755   CREATININE 0.84 05/29/2021 1755   CALCIUM 9.0 05/29/2021 1755   PROT 7.1 05/29/2021 1755   ALBUMIN 3.8 05/29/2021 1755   AST 23 05/29/2021 1755   ALT 25 05/29/2021 1755   ALKPHOS 95 05/29/2021 1755   BILITOT 0.6 05/29/2021 1755   GFRNONAA >60 05/29/2021 1755         RADIOGRAPHY: MM CLIP PLACEMENT LEFT  Result Date: 07/29/2021 CLINICAL DATA:  Postprocedure mammogram. EXAM: 3D DIAGNOSTIC LEFT MAMMOGRAM POST STEREOTACTIC BIOPSY COMPARISON:  Previous exam(s). FINDINGS: 3D Mammographic images were obtained following stereotactic guided biopsy of calcifications in the upper outer left breast at middle to posterior depth. The biopsy marking clip is in expected  position at the site of biopsy. The X shaped clip, denoting site of biopsy-proven DCIS in the upper outer breast at posterior depth, is located 3.4 cm posterior to today's biopsy site. IMPRESSION: Appropriate positioning of the ribbon shaped biopsy marking clip at the site of biopsy in the upper outer left breast at middle to posterior depth. Final Assessment: Post Procedure Mammograms for Marker Placement Electronically Signed   By: Ileana Roup M.D.   On: 07/29/2021 13:43  MM LT BREAST BX W LOC DEV 1ST LESION IMAGE BX SPEC STEREO GUIDE  Addendum Date: 08/04/2021   ADDENDUM REPORT: 08/04/2021 08:02 ADDENDUM: PATHOLOGY revealed: A. LEFT BREAST, UPPER OUTER QUADRANT CALCIFICATIONS; STEREOTACTIC BIOPSY: - BENIGN BREAST TISSUE WITH FIBROCYSTIC CHANGES AND SCLEROSING ADENOSIS. - CALCIFICATIONS  ARE NOTED IN ASSOCIATION WITH BENIGN BREAST TISSUE. - NEGATIVE FOR ATYPIA AND MALIGNANCY. Comment: Deeper levels were examined (blocks A1, A2, A3, A4, A6). Pathology results are CONCORDANT with imaging findings, per Dr. Ileana Roup. Pathology results and recommendations were discussed with patient via telephone on 08/02/2021. Patient reported biopsy site doing well with no adverse symptoms, and only slight tenderness at the site. Post biopsy care instructions were reviewed, questions were answered and my direct phone number was provided. Patient was instructed to call [facility] for any additional questions or concerns related to biopsy site. RECOMMENDATIONS: Surgical consultation for recently diagnosed LEFT breast cancer has been arranged for patient by Al Pimple RN at [facility]. Pathology results reported by Electa Sniff RN on 08/02/2021. Electronically Signed   By: Ileana Roup M.D.   On: 08/04/2021 08:02   Result Date: 08/04/2021 CLINICAL DATA:  Calcifications in the upper-outer left breast. Biopsy of calcifications located more posteriorly in the upper outer left breast, sampled on 07/16/2021, demonstrate DCIS. Additional biopsy of more anteriorly located calcifications today to evaluate for extent of disease. EXAM: LEFT BREAST STEREOTACTIC CORE NEEDLE BIOPSY COMPARISON:  Previous exams. FINDINGS: The patient and I discussed the procedure of stereotactic-guided biopsy including benefits and alternatives. We discussed the high likelihood of a successful procedure. We discussed the risks of the procedure including infection, bleeding, tissue injury, clip migration, and inadequate sampling. Informed written consent was given. The usual time out protocol was performed immediately prior to the procedure. Using sterile technique and 1% Lidocaine as local anesthetic, under stereotactic guidance, a 9 gauge vacuum assisted device was used to perform core needle biopsy of  calcifications in the upper-outer quadrant of the left breast at middle to posterior depth using a superior approach. Specimen radiograph was performed showing representative calcifications. Specimens with calcifications are identified for pathology. Lesion quadrant: Upper outer quadrant At the conclusion of the procedure, a ribbon shaped tissue marker clip was deployed into the biopsy cavity. Follow-up 2-view mammogram was performed and dictated separately. IMPRESSION: Stereotactic-guided biopsy of calcifications in the upper outer left breast at middle to posterior depth. No apparent complications. Electronically Signed: By: Ileana Roup M.D. On: 07/29/2021 13:51      IMPRESSION/PLAN: ***   It was a pleasure meeting the patient today. We discussed the risks, benefits, and side effects of radiotherapy. I recommend radiotherapy to the *** to reduce her risk of locoregional recurrence by 2/3.  We discussed that radiation would take approximately *** weeks to complete and that I would give the patient a few weeks to heal following surgery before starting treatment planning. *** If chemotherapy were to be given, this would precede radiotherapy. We spoke about acute effects including skin irritation and  fatigue as well as much less common late effects including internal organ injury or irritation. We spoke about the latest technology that is used to minimize the risk of late effects for patients undergoing radiotherapy to the breast or chest wall. No guarantees of treatment were given. The patient is enthusiastic about proceeding with treatment. I look forward to participating in the patient's care.  I will await her referral back to me for postoperative follow-up and eventual CT simulation/treatment planning.  On date of service, in total, I spent *** minutes on this encounter. Patient was seen in person.   __________________________________________   Eppie Gibson, MD  This document serves as a record of  services personally performed by Eppie Gibson, MD. It was created on her behalf by Roney Mans, a trained medical scribe. The creation of this record is based on the scribe's personal observations and the provider's statements to them. This document has been checked and approved by the attending provider.

## 2021-08-25 ENCOUNTER — Ambulatory Visit
Admission: RE | Admit: 2021-08-25 | Discharge: 2021-08-25 | Disposition: A | Discharge status: Home / Self Care | Payer: Medicare Other | Source: Ambulatory Visit | Attending: Radiation Oncology | Admitting: Radiation Oncology

## 2021-08-25 ENCOUNTER — Telehealth: Payer: Self-pay | Admitting: Hematology and Oncology

## 2021-08-25 ENCOUNTER — Ambulatory Visit: Payer: Medicare Other

## 2021-08-25 NOTE — Progress Notes (Incomplete)
Location of Breast Cancer:  Malignant neoplasm of upper-outer quadrant of left breast in female, estrogen receptor positive  Histology per Pathology Report:  (Definitive pathology pending upcoming surgery) 07/29/2021 DIAGNOSIS:  A. LEFT BREAST, UPPER OUTER QUADRANT CALCIFICATIONS; STEREOTACTIC  BIOPSY:  - BENIGN BREAST TISSUE WITH FIBROCYSTIC CHANGES AND SCLEROSING ADENOSIS.  - CALCIFICATIONS ARE NOTED IN ASSOCIATION WITH BENIGN BREAST TISSUE.  - NEGATIVE FOR ATYPIA AND MALIGNANCY  07/16/2021 DIAGNOSIS:  A. LEFT BREAST, UPPER OUTER CALCIFICATIONS; STEREOTACTIC BIOPSY:  - DUCTAL CARCINOMA IN SITU, INTERMEDIATE GRADE, WITH CALCIFICATIONS AND NECROSIS.  Receptor Status: (07/16/21): ER(91-100%)  Did patient present with symptoms (if so, please note symptoms) or was this found on screening mammography?: Screening mammogram on 04/29/2020 showed calcifications in the left breast. Diagnostic mammogram and [place] on 07/01/2021 showed upper outer quadrant indeterminate calcifications in the left breast.  Past/Anticipated interventions by surgeon, if any:  09/09/2021 --Dr. Stark Klein Scheduled for LEFT MASTECTOMY WITH SENTINEL LYMPH NODE BIOPSY  Past/Anticipated interventions by medical oncology, if any:  Under care of Dr. Nicholas Lose 08/18/2021 --Recommendation: Breast conserving surgery versus mastectomy (Patient had right mastectomy about 20 years ago) If she undergoes lumpectomy and she will need adjuvant radiation therapy Followed by antiestrogen therapy with tamoxifen 5 years Patient is leaning on doing left mastectomy so that she can be more symmetric. She will need some assistance with taking care of the postmastectomy care. Her power of attorney accompanying her today and felt that they can help her with some of this post op care. Return to clinic after surgery to discuss the final pathology report   Lymphedema issues, if any:  ***    Pain issues, if any:  ***   SAFETY  ISSUES: Prior radiation? *** Pacemaker/ICD? *** Possible current pregnancy? No--postmenopausal Is the patient on methotrexate? ***  Current Complaints / other details:  ***

## 2021-08-25 NOTE — Telephone Encounter (Signed)
Scheduled per sch msg. Called, not able to leave msg. Mailed printotu  

## 2021-08-30 ENCOUNTER — Telehealth: Payer: Self-pay | Admitting: Radiation Oncology

## 2021-08-30 NOTE — Telephone Encounter (Signed)
Called Bianca Fletcher to r/s patients missed appt with Dr. Isidore Moos. No answer, LVM for return call.

## 2021-08-31 ENCOUNTER — Encounter: Payer: Self-pay | Admitting: *Deleted

## 2021-08-31 ENCOUNTER — Telehealth: Payer: Self-pay | Admitting: Radiation Oncology

## 2021-08-31 NOTE — Telephone Encounter (Signed)
Returned [employer] call regarding patients appt. No answer, LVM for return call.

## 2021-09-01 ENCOUNTER — Encounter (HOSPITAL_BASED_OUTPATIENT_CLINIC_OR_DEPARTMENT_OTHER): Payer: Self-pay | Admitting: General Surgery

## 2021-09-01 ENCOUNTER — Other Ambulatory Visit: Payer: Self-pay

## 2021-09-02 NOTE — Pre-Procedure Instructions (Signed)

## 2021-09-07 ENCOUNTER — Ambulatory Visit: Payer: Medicare Other

## 2021-09-07 ENCOUNTER — Ambulatory Visit
Admission: RE | Admit: 2021-09-07 | Discharge: 2021-09-07 | Disposition: A | Discharge status: Home / Self Care | Payer: Medicare Other | Source: Ambulatory Visit | Attending: Hematology and Oncology | Admitting: Hematology and Oncology

## 2021-09-07 ENCOUNTER — Encounter: Payer: Self-pay | Admitting: Radiation Oncology

## 2021-09-07 ENCOUNTER — Ambulatory Visit: Payer: Medicare Other | Admitting: Radiation Oncology

## 2021-09-07 ENCOUNTER — Other Ambulatory Visit: Payer: Self-pay

## 2021-09-07 VITALS — BP 120/84 | HR 68 | Temp 97.2°F | Resp 20 | Ht 63.0 in | Wt 263.1 lb

## 2021-09-07 DIAGNOSIS — Z79899 Other long term (current) drug therapy: Secondary | ICD-10-CM | POA: Diagnosis not present

## 2021-09-07 DIAGNOSIS — Z803 Family history of malignant neoplasm of breast: Secondary | ICD-10-CM | POA: Diagnosis not present

## 2021-09-07 DIAGNOSIS — Z9011 Acquired absence of right breast and nipple: Secondary | ICD-10-CM | POA: Diagnosis not present

## 2021-09-07 DIAGNOSIS — F329 Major depressive disorder, single episode, unspecified: Secondary | ICD-10-CM | POA: Diagnosis not present

## 2021-09-07 DIAGNOSIS — K219 Gastro-esophageal reflux disease without esophagitis: Secondary | ICD-10-CM | POA: Diagnosis not present

## 2021-09-07 DIAGNOSIS — D0512 Intraductal carcinoma in situ of left breast: Secondary | ICD-10-CM | POA: Diagnosis not present

## 2021-09-07 DIAGNOSIS — Z87891 Personal history of nicotine dependence: Secondary | ICD-10-CM | POA: Insufficient documentation

## 2021-09-07 DIAGNOSIS — Z170 Estrogen receptor positive status [ER+]: Secondary | ICD-10-CM | POA: Insufficient documentation

## 2021-09-07 DIAGNOSIS — E7800 Pure hypercholesterolemia, unspecified: Secondary | ICD-10-CM | POA: Diagnosis not present

## 2021-09-07 NOTE — Progress Notes (Signed)
Radiation Oncology         (336) [phone] ________________________________  Initial Outpatient Consultation  Name: Bianca Fletcher MRN: [id number]  Date: 09/07/2021  DOB: December 28, 1965  [id number], Fransico Him, MD  Stark Klein, MD   REFERRING PHYSICIAN: Stark Klein, MD  DIAGNOSIS:    ICD-10-CM   1. Ductal carcinoma in situ (DCIS) of left breast  D05.12      Stage 0 (cTis (DCIS), cN0, cM0) Left Breast Ductal Carcinoma in-situ, ER+ / PR not assessed / Her2 not assessed,   CHIEF COMPLAINT: Here to discuss management of left breast DCIS  HISTORY OF PRESENT ILLNESS::Bianca Fletcher is a 55 y.o. female who presented with left breast abnormality on the following imaging: unilateral left screening mammogram on the date of 04/29/2020.  No symptoms, if any, were reported at that time, though the patient has a history of right breast cancer s/p mastectomy approx 19 years ago. Diagnostic left mammogram and ultrasound on 07/01/2021 showed upper outer quadrant indeterminate calcifications in the left breast measuring 3.4 cm in the greatest AP dimension.   Left breast UOQ biopsy of the calcifications on date of 07/16/21 showed intermediate grade DCIS with calcifications and necrosis.  ER status: positive; PR status: not assessed; Her2 status: not assessed. No lymph nodes were biopsied  Additional left breast biopsy collected on 07/29/21 revealed benign breast tissue with fibrocystic changes and sclerosing adenosis.   The patient was then referred to Dr. Barry Dienes to discuss surgical options. During this visit, the patient voiced concern regarding cost of treatment. (Dr. Barry Dienes will look into if she is a candidate for the COMET trial). Dr. Barry Dienes recommended treatment consisting of lumpectomy and radiation for ease of recovery over mastectomy. Of note: The patient reported current swelling in her left shoulder, pain, and decreased ROM during this visit.  She recently underwent an injection in the left shoulder for  bursitis.  Since then her range of motion has improved  The patient wishes to proceed with left mastectomy, and is scheduled for surgery on 09/09/21 (with SLN biopsies).  (The patient also met with Dr. Lindi Adie on 08/18/21 to discuss further treatment options. Dr. Lindi Adie will have the patient return after her mastectomy to discuss further treatment pending pathology).   Of note: the patient has a history of right breast cancer and underwent right mastectomy by Dr. Margot Chimes as above.  Mariusz Jubb, her caregiver, is present today  PREVIOUS RADIATION THERAPY: No  PAST MEDICAL HISTORY:  has a past medical history of Breast cancer ([place]) (2003), Cancer ([place]) (2003), Depression, GERD (gastroesophageal reflux disease), High cholesterol, and Migraine.    PAST SURGICAL HISTORY: Past Surgical History:  Procedure Laterality Date   BREAST BIOPSY Left 07/16/2021   stereo bx,x-clip, path pending   BREAST BIOPSY Left 07/29/2021   stereo bx, ribbon clip, path pending   BREAST SURGERY     MASTECTOMY Right 2003   positive   TONSILLECTOMY      FAMILY HISTORY: family history includes Alzheimer's disease in her mother; Breast cancer in her paternal grandmother; Gout in her father; Kidney disease in her father; Ulcers in her father.  SOCIAL HISTORY:  reports that she has quit smoking. She has never used smokeless tobacco. She reports that she does not drink alcohol and does not use drugs.  ALLERGIES: Amoxicillin, Butalbital-apap-caffeine, and Penicillins  MEDICATIONS:  Current Outpatient Medications  Medication Sig Dispense Refill   buPROPion (WELLBUTRIN XL) 150 MG 24 hr tablet Take 300 mg by mouth daily.  clotrimazole (LOTRIMIN) 1 % cream Apply to affected area 2 times daily (Patient taking differently: Apply 1 application topically daily as needed (infection).) 15 g 0   escitalopram (LEXAPRO) 10 MG tablet Take 10 mg by mouth daily.     propranolol ER (INDERAL LA) 80 MG 24 hr capsule Take 80 mg by mouth  daily.     rosuvastatin (CRESTOR) 20 MG tablet Take 20 mg by mouth daily.     SUMAtriptan (IMITREX) 100 MG tablet Take 100 mg by mouth daily as needed for migraine.     topiramate (TOPAMAX) 100 MG tablet Take 100 mg by mouth daily.     Current Facility-Administered Medications  Medication Dose Route Frequency Provider Last Rate Last Admin   0.9 %  sodium chloride infusion  500 mL Intravenous Once Armbruster, Carlota Raspberry, MD        REVIEW OF SYSTEMS: As above in HPI.   PHYSICAL EXAM:  height is 5' 3"  (1.6 m) and weight is 263 lb 2 oz (119.4 kg). Her temporal temperature is 97.2 F (36.2 C) (abnormal). Her blood pressure is 120/84 and her pulse is 68. Her respiration is 20 and oxygen saturation is 98%.   General: Alert and oriented, in no acute distress  Extremities: Good range of motion in left shoulder  ECOG = 2  0 - Asymptomatic (Fully active, able to carry on all predisease activities without restriction)  1 - Symptomatic but completely ambulatory (Restricted in physically strenuous activity but ambulatory and able to carry out work of a light or sedentary nature. For example, light housework, office work)  2 - Symptomatic, <50% in bed during the day (Ambulatory and capable of all self care but unable to carry out any work activities. Up and about more than 50% of waking hours)  3 - Symptomatic, >50% in bed, but not bedbound (Capable of only limited self-care, confined to bed or chair 50% or more of waking hours)  4 - Bedbound (Completely disabled. Cannot carry on any self-care. Totally confined to bed or chair)  5 - Death   Eustace Pen MM, Creech RH, Tormey DC, et al. [phone]). "Toxicity and response criteria of the [facility] Group". [place] Oncol. 5 (6): 649-55   LABORATORY DATA:  Lab Results  Component Value Date   WBC 9.0 05/29/2021   HGB 13.0 05/29/2021   HCT 41.2 05/29/2021   MCV 88.6 05/29/2021   PLT 279 05/29/2021   CMP     Component Value Date/Time    NA 139 05/29/2021 1755   K 3.9 05/29/2021 1755   CL 108 05/29/2021 1755   CO2 25 05/29/2021 1755   GLUCOSE 100 (H) 05/29/2021 1755   BUN 15 05/29/2021 1755   CREATININE 0.84 05/29/2021 1755   CALCIUM 9.0 05/29/2021 1755   PROT 7.1 05/29/2021 1755   ALBUMIN 3.8 05/29/2021 1755   AST 23 05/29/2021 1755   ALT 25 05/29/2021 1755   ALKPHOS 95 05/29/2021 1755   BILITOT 0.6 05/29/2021 1755   GFRNONAA >60 05/29/2021 1755        RADIOGRAPHY:  as above    IMPRESSION/PLAN: This a very nice 55 year old woman with ER positive DCIS of the left breast.  She will undergo mastectomy without reconstructive surgery later this week.  She is intent on mastectomy.  Sentinel node biopsy will also be performed.   It was a pleasure meeting the patient today.  We discussed the indications for postmastectomy radiation.  It is unlikely that she will  need postmastectomy radiation.  I will see her back if needed depending on the pathology results and be available at tumor board to discuss her case moving forward.  Patient and her caregiver, Judson Roch, appreciated our consultation today and know to call if further questions arise in the future.  Egbert Garibaldi has messaged Dr. Barry Dienes regarding their concerns for postoperative care and desire for home health referral  On date of service, in total, I spent 30 minutes on this encounter. Patient was seen in person.   __________________________________________   Eppie Gibson, MD  This document serves as a record of services personally performed by Eppie Gibson, MD. It was created on her behalf by Roney Mans, a trained medical scribe. The creation of this record is based on the scribe's personal observations and the provider's statements to them. This document has been checked and approved by the attending provider.

## 2021-09-07 NOTE — Progress Notes (Signed)
Location of Breast Cancer:  Malignant neoplasm of upper-outer quadrant of left breast in female, estrogen receptor positive   Histology per Pathology Report:  (Definitive pathology pending upcoming surgery) 07/29/2021 DIAGNOSIS:  A. LEFT BREAST, UPPER OUTER QUADRANT CALCIFICATIONS; STEREOTACTIC  BIOPSY:  - BENIGN BREAST TISSUE WITH FIBROCYSTIC CHANGES AND SCLEROSING ADENOSIS.  - CALCIFICATIONS ARE NOTED IN ASSOCIATION WITH BENIGN BREAST TISSUE.  - NEGATIVE FOR ATYPIA AND MALIGNANCY   07/16/2021 DIAGNOSIS:  A. LEFT BREAST, UPPER OUTER CALCIFICATIONS; STEREOTACTIC BIOPSY:  - DUCTAL CARCINOMA IN SITU, INTERMEDIATE GRADE, WITH CALCIFICATIONS AND NECROSIS.   Receptor Status: (07/16/21): ER(91-100%)   Did patient present with symptoms (if so, please note symptoms) or was this found on screening mammography?: Screening mammogram on 04/29/2020 showed calcifications in the left breast. Diagnostic mammogram and [place] on 07/01/2021 showed upper outer quadrant indeterminate calcifications in the left breast.   Past/Anticipated interventions by surgeon, if any:  09/09/2021 --Dr. Stark Klein Scheduled for LEFT MASTECTOMY WITH SENTINEL LYMPH NODE BIOPSY   Past/Anticipated interventions by medical oncology, if any:  Under care of Dr. Nicholas Lose 08/18/2021 --Recommendation: Breast conserving surgery versus mastectomy (Patient had right mastectomy about 20 years ago) If she undergoes lumpectomy and she will need adjuvant radiation therapy Followed by antiestrogen therapy with tamoxifen 5 years Patient is leaning on doing left mastectomy so that she can be more symmetric. She will need some assistance with taking care of the postmastectomy care. Her power of attorney accompanying her today and felt that they can help her with some of this post op care. Return to clinic after surgery to discuss the final pathology report  F/U scheduled for 10/06/2021   Lymphedema issues, if any:  Patient denies      Pain issues, if any:  Patient denies    SAFETY ISSUES: Prior radiation? No Pacemaker/ICD? No Possible current pregnancy? No--postmenopausal Is the patient on methotrexate? No   Current Complaints / other details:  Has bursitis in left shoulder (received cortisone shot by Dr. Merla Riches on 09/02/2021)

## 2021-09-08 NOTE — H&P (Signed)
REFERRING PHYSICIAN: Dorise Bullion, MD  PROVIDER: Georgianne Fick, MD  Care Team: Patient Care Team: Domenick Gong, MD as PCP - General (Internal Medicine)   MRN: [id number] DOB: 1966/06/05 DATE OF ENCOUNTER: 08/13/2021  Subjective   Chief Complaint: Left Breast Cancer   History of Present Illness: Bianca Fletcher is a 55 y.o. female who is seen today as an office consultation at the request of Dr. Osborne Casco for evaluation of Left Breast Cancer  Pt presents with screening detected left breast cancer 07/2021. She has h/o right breast cancer and had mastectomy by Dr. Margot Chimes at that time. This time she had calcifications seen on the left. Diagnostic mammogram was done and there were 3.4 cm of calcs in the UOQ. Core needle biopsy was done and was positive for DCIS, ER+. Once that came back, the anterior portion of calcs were biopsied as well for extent of disease. This anterior biopsy was negative.   Diagnostic mammogram: 07/01/21 ACR Breast Density Category b: There are scattered areas of fibroglandular density.   FINDINGS: Mammographic views of the left breast demonstrate no suspicious masses or areas of architectural distortion. There is a group of indeterminate calcifications in the upper outer left breast, posterior depth, measuring 3.4 cm in greatest AP dimension.   IMPRESSION: Left breast upper outer quadrant indeterminate calcifications, for which stereotactic core needle biopsy is recommended.   RECOMMENDATION: Stereotactic core needle biopsy of the left breast.   I have discussed the findings and recommendations with the patient. If applicable, a reminder letter will be sent to the patient regarding the next appointment.   BI-RADS CATEGORY 4: Suspicious.  Pathology core needle biopsy: 07/16/21 A. LEFT BREAST, UPPER OUTER CALCIFICATIONS; STEREOTACTIC BIOPSY:  - DUCTAL CARCINOMA IN SITU, INTERMEDIATE GRADE, WITH CALCIFICATIONS AND  NECROSIS  Receptors: CASE  SUMMARY: BREAST BIOMARKER TESTS  Estrogen Receptor (ER) Status: POSITIVE          Percentage of cells with nuclear positivity: 91-100%          Average intensity of staining: Strong  Review of Systems: A complete review of systems was obtained from the patient. I have reviewed this information and discussed as appropriate with the patient. See HPI as well for other ROS.  Review of Systems  Skin: Positive for itching.  Psychiatric/Behavioral: Positive for depression.  All other systems reviewed and are negative.   Medical History: Past Medical History:  Diagnosis Date   Anxiety   Depression   GERD (gastroesophageal reflux disease)   History of breast cancer  Right   History of chicken pox   Hypercholesterolemia   Migraines   Patient Active Problem List  Diagnosis   Pure hypercholesterolemia   Depression, major, in remission (CMS-HCC)   Morbid obesity with body mass index of 40.0-44.9 in adult (CMS-HCC)   History of breast cancer   Migraine without aura and without status migrainosus, not intractable   Malignant neoplasm of upper-outer quadrant of left breast in female, estrogen receptor positive (CMS-HCC)   Acute pain of left shoulder   Past Surgical History:  Procedure Laterality Date   MASTECTOMY Right 2004-2005    Allergies  Allergen Reactions   Amoxicillin (Bulk) Unknown   Fioricet [Butalbital-Acetaminophen-Caff] Unknown   Penicillin Unknown   Current Outpatient Medications on File Prior to Visit  Medication Sig Dispense Refill   buPROPion (WELLBUTRIN XL) 150 MG XL tablet Take 2 tablets (300 mg total) by mouth once daily. 180 tablet 3   escitalopram oxalate (LEXAPRO) 10 MG tablet  Take 1 tablet (10 mg total) by mouth once daily. 90 tablet 3   naproxen (NAPROSYN) 500 MG tablet Take 1 tablet (500 mg total) by mouth 2 (two) times daily as needed (pain) for up to 30 doses. 30 tablet 2   propranolol (INDERAL LA) 80 MG 24 hr capsule TAKE 1 CAPSULE BY MOUTH EVERY DAY 90  capsule 0   simvastatin (ZOCOR) 20 MG tablet Take 1 tablet (20 mg total) by mouth nightly. 90 tablet 3   topiramate (TOPAMAX) 100 MG tablet Take 1 tablet (100 mg total) by mouth 2 (two) times daily. 180 tablet 3   No current facility-administered medications on file prior to visit.   Family History  Problem Relation Age of Onset   Stroke Mother   Breast cancer Mother   Prostate cancer Father   Allergic rhinitis Father   Diabetes type II Father    Social History   Tobacco Use  Smoking Status Never  Smokeless Tobacco Not on file    Social History   Socioeconomic History   Marital status: Divorced  Tobacco Use   Smoking status: Never  Substance and Sexual Activity   Alcohol use: No   Drug use: No   Objective:   Vitals:  08/13/21 1107  BP: (!) 140/86  Pulse: 76  Temp: 36.7 C (98.1 F)  SpO2: 98%  Weight: (!) 123 kg (271 lb 3.2 oz)  Height: 162.6 cm (5\' 4" )   Body mass index is 46.55 kg/m.  Gen: No acute distress. Well nourished and well groomed.  Neurological: Alert and oriented to person, place, and time. Coordination normal.  Head: Normocephalic and atraumatic.  Eyes: Conjunctivae are normal. Pupils are equal, round, and reactive to light. No scleral icterus.  Neck: Normal range of motion. Neck supple. No tracheal deviation or thyromegaly present.  Cardiovascular: Normal rate, regular rhythm, normal heart sounds and intact distal pulses. Exam reveals no gallop and no friction rub. No murmur heard. Breast: Right breast surgically absent, no nodules. Left breast ptotic. No palpable masses. No skin dimpling. No LAD. No nipple retraction or nipple discharge.  Respiratory: Effort normal. No respiratory distress. No chest wall tenderness. Breath sounds normal. No wheezes, rales or rhonchi.  GI: Soft. Bowel sounds are normal. The abdomen is soft and nontender. There is no rebound and no guarding.  Musculoskeletal: Normal range of motion. Extremities are nontender except  for left shoulder. It seems as though multiple spots are tender depending on what position her arm is in.  Lymphadenopathy: No cervical, preauricular, postauricular or axillary adenopathy is present Skin: Skin is warm and dry. No rash noted. No diaphoresis. No erythema. No pallor. No clubbing, cyanosis, or edema.  Psychiatric: Normal mood and affect. Behavior is normal. Judgment and thought content normal.   Assessment and Plan:   Malignant neoplasm of upper-outer quadrant of left breast in female, estrogen receptor positive (CMS-HCC)  She is very concerned about bills and wants a little treatment as possible. Her POA was present by phone. She has an appt with oncology next week. I will see if she is a candidate for the COMET trial. If not, I will recommend surgery. We had a long discussion of possibilities. I advised that lumpectomy and radiation is equivalent to mastectomy and would be an easier recovery. She has had swelling in her left shoulder and pain and decreased ROM recently. I reviewed that this can complicate mastectomy recovery. However, she is likely not to need radiation if she has mastectomy. She would  then have symmetry with the opposite side.  I also discussed the possibility of reconstruction. She was then a bit overwhelmed, but is interested in finding out about it as long as it doesn't increase her risk of cancer coming back.   I would lean toward lumpectomy with radiation as a treatment. She is going to discuss this with her POA and get all the information prior to making a decision about type of surgery.   History of breast cancer Right side did not get reconstruction due to her lack of insurance. I am not sure if she is a candidate for reconstruction of this side or not.   Acute pain of left shoulder I am not sure what is going on with her shoulder, but her left upper arm is certainly swollen and her ROM is a bit restricted. I will send to ortho or sports medicine to  evaluate.   Return for breast cancer follow up, timing to be determined. Milus Height, MD FACS Surgical Oncology, General Surgery, Trauma and [facility] [place]

## 2021-09-09 ENCOUNTER — Ambulatory Visit (HOSPITAL_BASED_OUTPATIENT_CLINIC_OR_DEPARTMENT_OTHER): Payer: Medicare Other | Admitting: Anesthesiology

## 2021-09-09 ENCOUNTER — Encounter (HOSPITAL_BASED_OUTPATIENT_CLINIC_OR_DEPARTMENT_OTHER): Payer: Self-pay | Admitting: General Surgery

## 2021-09-09 ENCOUNTER — Encounter (HOSPITAL_BASED_OUTPATIENT_CLINIC_OR_DEPARTMENT_OTHER)
Admission: RE | Disposition: A | Discharge status: Home / Self Care | Payer: Self-pay | Source: Home / Self Care | Attending: General Surgery

## 2021-09-09 ENCOUNTER — Other Ambulatory Visit: Payer: Self-pay

## 2021-09-09 ENCOUNTER — Ambulatory Visit (HOSPITAL_BASED_OUTPATIENT_CLINIC_OR_DEPARTMENT_OTHER)
Admission: RE | Admit: 2021-09-09 | Discharge: 2021-09-10 | Disposition: A | Discharge status: Home / Self Care | Payer: Medicare Other | Attending: General Surgery | Admitting: General Surgery

## 2021-09-09 DIAGNOSIS — E669 Obesity, unspecified: Secondary | ICD-10-CM | POA: Diagnosis not present

## 2021-09-09 DIAGNOSIS — C50412 Malignant neoplasm of upper-outer quadrant of left female breast: Secondary | ICD-10-CM | POA: Diagnosis not present

## 2021-09-09 DIAGNOSIS — Z6841 Body Mass Index (BMI) 40.0 and over, adult: Secondary | ICD-10-CM | POA: Diagnosis not present

## 2021-09-09 DIAGNOSIS — Z170 Estrogen receptor positive status [ER+]: Secondary | ICD-10-CM | POA: Diagnosis not present

## 2021-09-09 DIAGNOSIS — Z9011 Acquired absence of right breast and nipple: Secondary | ICD-10-CM | POA: Diagnosis not present

## 2021-09-09 DIAGNOSIS — Z87891 Personal history of nicotine dependence: Secondary | ICD-10-CM | POA: Insufficient documentation

## 2021-09-09 DIAGNOSIS — C50912 Malignant neoplasm of unspecified site of left female breast: Secondary | ICD-10-CM | POA: Diagnosis present

## 2021-09-09 DIAGNOSIS — M25512 Pain in left shoulder: Secondary | ICD-10-CM | POA: Insufficient documentation

## 2021-09-09 DIAGNOSIS — F79 Unspecified intellectual disabilities: Secondary | ICD-10-CM

## 2021-09-09 DIAGNOSIS — Z853 Personal history of malignant neoplasm of breast: Secondary | ICD-10-CM | POA: Diagnosis not present

## 2021-09-09 HISTORY — PX: MASTECTOMY W/ SENTINEL NODE BIOPSY: SHX2001

## 2021-09-09 HISTORY — DX: Pure hypercholesterolemia, unspecified: E78.00

## 2021-09-09 HISTORY — DX: Gastro-esophageal reflux disease without esophagitis: K21.9

## 2021-09-09 SURGERY — MASTECTOMY WITH SENTINEL LYMPH NODE BIOPSY
Anesthesia: General | Site: Breast | Laterality: Left

## 2021-09-09 MED ORDER — MELATONIN 3 MG PO TABS: 3.0000 mg | ORAL_TABLET | Freq: Every evening | ORAL | Status: DC | PRN

## 2021-09-09 MED ORDER — FENTANYL CITRATE (PF) 100 MCG/2ML IJ SOLN
INTRAMUSCULAR | Status: AC
  Filled 2021-09-09: qty 2

## 2021-09-09 MED ORDER — METHOCARBAMOL 500 MG PO TABS: 500.0000 mg | ORAL_TABLET | Freq: Four times a day (QID) | ORAL | Status: DC | PRN

## 2021-09-09 MED ORDER — SUMATRIPTAN SUCCINATE 100 MG PO TABS: 100.0000 mg | ORAL_TABLET | Freq: Every day | ORAL | Status: DC | PRN

## 2021-09-09 MED ORDER — ONDANSETRON HCL 4 MG/2ML IJ SOLN: 4.0000 mg | Freq: Once | INTRAMUSCULAR | Status: DC | PRN

## 2021-09-09 MED ORDER — FENTANYL CITRATE (PF) 100 MCG/2ML IJ SOLN
100.0000 ug | Freq: Once | INTRAMUSCULAR | Status: AC
  Administered 2021-09-09: 50 ug via INTRAVENOUS

## 2021-09-09 MED ORDER — ACETAMINOPHEN 500 MG PO TABS
1000.0000 mg | ORAL_TABLET | Freq: Four times a day (QID) | ORAL | Status: DC
  Administered 2021-09-09 – 2021-09-10 (×2): 1000 mg via ORAL
  Filled 2021-09-09 (×2): qty 2

## 2021-09-09 MED ORDER — CIPROFLOXACIN IN D5W 400 MG/200ML IV SOLN
INTRAVENOUS | Status: AC
  Filled 2021-09-09: qty 200

## 2021-09-09 MED ORDER — METOCLOPRAMIDE HCL 5 MG/ML IJ SOLN
INTRAMUSCULAR | Status: DC | PRN
  Administered 2021-09-09: 10 mg via INTRAVENOUS

## 2021-09-09 MED ORDER — ACETAMINOPHEN 500 MG PO TABS
1000.0000 mg | ORAL_TABLET | ORAL | Status: AC
  Administered 2021-09-09: 1000 mg via ORAL

## 2021-09-09 MED ORDER — METHOCARBAMOL 500 MG PO TABS
500.0000 mg | ORAL_TABLET | Freq: Four times a day (QID) | ORAL | 0 refills | Status: DC | PRN
Start: 2021-09-09 — End: 2023-03-23

## 2021-09-09 MED ORDER — PROPOFOL 10 MG/ML IV BOLUS
INTRAVENOUS | Status: DC | PRN
  Administered 2021-09-09: 200 mg via INTRAVENOUS

## 2021-09-09 MED ORDER — DIPHENHYDRAMINE HCL 25 MG PO CAPS: 25.0000 mg | ORAL_CAPSULE | Freq: Four times a day (QID) | ORAL | Status: DC | PRN

## 2021-09-09 MED ORDER — ROSUVASTATIN CALCIUM 20 MG PO TABS
20.0000 mg | ORAL_TABLET | Freq: Every day | ORAL | Status: DC
  Administered 2021-09-09: 20 mg via ORAL
  Filled 2021-09-09: qty 1

## 2021-09-09 MED ORDER — LIDOCAINE HCL (CARDIAC) PF 100 MG/5ML IV SOSY
PREFILLED_SYRINGE | INTRAVENOUS | Status: DC | PRN
  Administered 2021-09-09: 80 mg via INTRAVENOUS

## 2021-09-09 MED ORDER — BUPIVACAINE LIPOSOME 1.3 % IJ SUSP
INTRAMUSCULAR | Status: DC | PRN
  Administered 2021-09-09: 10 mL via PERINEURAL

## 2021-09-09 MED ORDER — MIDAZOLAM HCL 2 MG/2ML IJ SOLN
INTRAMUSCULAR | Status: AC
  Filled 2021-09-09: qty 2

## 2021-09-09 MED ORDER — FENTANYL CITRATE (PF) 100 MCG/2ML IJ SOLN
INTRAMUSCULAR | Status: DC | PRN
  Administered 2021-09-09: 25 ug via INTRAVENOUS
  Administered 2021-09-09 (×2): 50 ug via INTRAVENOUS
  Administered 2021-09-09: 25 ug via INTRAVENOUS

## 2021-09-09 MED ORDER — LACTATED RINGERS IV SOLN: INTRAVENOUS | Status: DC

## 2021-09-09 MED ORDER — GABAPENTIN 100 MG PO CAPS: 100.0000 mg | ORAL_CAPSULE | Freq: Two times a day (BID) | ORAL | Status: DC

## 2021-09-09 MED ORDER — ONDANSETRON HCL 4 MG/2ML IJ SOLN
INTRAMUSCULAR | Status: DC | PRN
  Administered 2021-09-09: 4 mg via INTRAVENOUS

## 2021-09-09 MED ORDER — ONDANSETRON 4 MG PO TBDP
4.0000 mg | ORAL_TABLET | Freq: Four times a day (QID) | ORAL | Status: DC | PRN
  Administered 2021-09-09: 4 mg via ORAL
  Filled 2021-09-09: qty 1

## 2021-09-09 MED ORDER — NAPROXEN 250 MG PO TABS: 500.0000 mg | ORAL_TABLET | Freq: Two times a day (BID) | ORAL | Status: DC | PRN

## 2021-09-09 MED ORDER — FENTANYL CITRATE (PF) 100 MCG/2ML IJ SOLN
25.0000 ug | INTRAMUSCULAR | Status: DC | PRN
  Administered 2021-09-09: 25 ug via INTRAVENOUS

## 2021-09-09 MED ORDER — BUPROPION HCL ER (XL) 300 MG PO TB24
300.0000 mg | ORAL_TABLET | Freq: Every day | ORAL | Status: DC
  Administered 2021-09-10: 300 mg via ORAL
  Filled 2021-09-09: qty 1

## 2021-09-09 MED ORDER — BUPIVACAINE HCL (PF) 0.5 % IJ SOLN
INTRAMUSCULAR | Status: DC | PRN
  Administered 2021-09-09: 20 mL via PERINEURAL

## 2021-09-09 MED ORDER — KCL IN DEXTROSE-NACL 20-5-0.9 MEQ/L-%-% IV SOLN
INTRAVENOUS | Status: DC
  Filled 2021-09-09: qty 1000

## 2021-09-09 MED ORDER — DIPHENHYDRAMINE HCL 50 MG/ML IJ SOLN: 25.0000 mg | Freq: Four times a day (QID) | INTRAMUSCULAR | Status: DC | PRN

## 2021-09-09 MED ORDER — DROPERIDOL 2.5 MG/ML IJ SOLN
INTRAMUSCULAR | Status: DC | PRN
  Administered 2021-09-09: .625 mg via INTRAVENOUS

## 2021-09-09 MED ORDER — TOPIRAMATE 100 MG PO TABS
100.0000 mg | ORAL_TABLET | Freq: Every day | ORAL | Status: DC
  Administered 2021-09-10: 100 mg via ORAL
  Filled 2021-09-09: qty 1

## 2021-09-09 MED ORDER — GABAPENTIN 100 MG PO CAPS: 100.0000 mg | ORAL_CAPSULE | Freq: Two times a day (BID) | ORAL | 0 refills | Status: AC

## 2021-09-09 MED ORDER — ONDANSETRON HCL 4 MG/2ML IJ SOLN: 4.0000 mg | Freq: Four times a day (QID) | INTRAMUSCULAR | Status: DC | PRN

## 2021-09-09 MED ORDER — HYDROMORPHONE HCL 1 MG/ML IJ SOLN: 0.5000 mg | INTRAMUSCULAR | Status: DC | PRN

## 2021-09-09 MED ORDER — MIDAZOLAM HCL 5 MG/5ML IJ SOLN
INTRAMUSCULAR | Status: DC | PRN
Start: 2021-09-09 — End: 2021-09-09
  Administered 2021-09-09: 2 mg via INTRAVENOUS

## 2021-09-09 MED ORDER — CIPROFLOXACIN IN D5W 400 MG/200ML IV SOLN: 400.0000 mg | Freq: Two times a day (BID) | INTRAVENOUS | Status: AC

## 2021-09-09 MED ORDER — CIPROFLOXACIN IN D5W 400 MG/200ML IV SOLN
400.0000 mg | INTRAVENOUS | Status: AC
  Administered 2021-09-09: 400 mg via INTRAVENOUS

## 2021-09-09 MED ORDER — OXYCODONE HCL 5 MG PO TABS: 5.0000 mg | ORAL_TABLET | ORAL | Status: DC | PRN

## 2021-09-09 MED ORDER — OXYCODONE HCL 5 MG PO TABS: 5.0000 mg | ORAL_TABLET | Freq: Once | ORAL | Status: DC | PRN

## 2021-09-09 MED ORDER — PROCHLORPERAZINE EDISYLATE 10 MG/2ML IJ SOLN: 5.0000 mg | Freq: Four times a day (QID) | INTRAMUSCULAR | Status: DC | PRN

## 2021-09-09 MED ORDER — OXYCODONE HCL 5 MG PO TABS: 5.0000 mg | ORAL_TABLET | ORAL | 0 refills | Status: DC | PRN

## 2021-09-09 MED ORDER — MIDAZOLAM HCL 2 MG/2ML IJ SOLN
2.0000 mg | Freq: Once | INTRAMUSCULAR | Status: AC
  Administered 2021-09-09: 2 mg via INTRAVENOUS

## 2021-09-09 MED ORDER — ACETAMINOPHEN 500 MG PO TABS
ORAL_TABLET | ORAL | Status: AC
  Filled 2021-09-09: qty 2

## 2021-09-09 MED ORDER — CHLORHEXIDINE GLUCONATE CLOTH 2 % EX PADS: 6.0000 | MEDICATED_PAD | Freq: Once | CUTANEOUS | Status: DC

## 2021-09-09 MED ORDER — PROCHLORPERAZINE MALEATE 10 MG PO TABS: 10.0000 mg | ORAL_TABLET | Freq: Four times a day (QID) | ORAL | Status: DC | PRN

## 2021-09-09 MED ORDER — OXYCODONE HCL 5 MG/5ML PO SOLN: 5.0000 mg | Freq: Once | ORAL | Status: DC | PRN

## 2021-09-09 MED ORDER — DEXAMETHASONE SODIUM PHOSPHATE 4 MG/ML IJ SOLN
INTRAMUSCULAR | Status: DC | PRN
  Administered 2021-09-09: 5 mg via INTRAVENOUS

## 2021-09-09 MED ORDER — 0.9 % SODIUM CHLORIDE (POUR BTL) OPTIME
TOPICAL | Status: DC | PRN
  Administered 2021-09-09: 300 mL

## 2021-09-09 MED ORDER — ESCITALOPRAM OXALATE 10 MG PO TABS
10.0000 mg | ORAL_TABLET | Freq: Every day | ORAL | Status: DC
  Administered 2021-09-10: 10 mg via ORAL
  Filled 2021-09-09: qty 1

## 2021-09-09 MED ORDER — PROPRANOLOL HCL ER 80 MG PO CP24
80.0000 mg | ORAL_CAPSULE | Freq: Every day | ORAL | Status: DC
  Administered 2021-09-10: 80 mg via ORAL
  Filled 2021-09-09: qty 1

## 2021-09-09 SURGICAL SUPPLY — 64 items
ADH SKN CLS APL DERMABOND .7 (GAUZE/BANDAGES/DRESSINGS) ×1
APL PRP STRL LF DISP 70% ISPRP (MISCELLANEOUS) ×1
BAG DECANTER FOR FLEXI CONT (MISCELLANEOUS) ×2 IMPLANT
BINDER BREAST 3XL (GAUZE/BANDAGES/DRESSINGS) ×2 IMPLANT
BIOPATCH RED 1 DISK 7.0 (GAUZE/BANDAGES/DRESSINGS) IMPLANT
BLADE HEX COATED 2.75 (ELECTRODE) ×2 IMPLANT
BLADE SURG 10 STRL SS (BLADE) ×2 IMPLANT
BLADE SURG 15 STRL LF DISP TIS (BLADE) ×1 IMPLANT
BLADE SURG 15 STRL SS (BLADE) ×2
CANISTER SUCT 1200ML W/VALVE (MISCELLANEOUS) ×2 IMPLANT
CHLORAPREP W/TINT 26 (MISCELLANEOUS) ×2 IMPLANT
CLIP TI LARGE 6 (CLIP) IMPLANT
CLIP TI MEDIUM 6 (CLIP) ×4 IMPLANT
CLIP TI WIDE RED SMALL 6 (CLIP) IMPLANT
COVER MAYO STAND STRL (DRAPES) IMPLANT
COVER PROBE W GEL 5X96 (DRAPES) ×2 IMPLANT
DECANTER SPIKE VIAL GLASS SM (MISCELLANEOUS) IMPLANT
DERMABOND ADVANCED (GAUZE/BANDAGES/DRESSINGS) ×1
DERMABOND ADVANCED .7 DNX12 (GAUZE/BANDAGES/DRESSINGS) ×1 IMPLANT
DRAIN CHANNEL 19F RND (DRAIN) ×2 IMPLANT
DRAPE UTILITY XL STRL (DRAPES) ×2 IMPLANT
DRSG PAD ABDOMINAL 8X10 ST (GAUZE/BANDAGES/DRESSINGS) ×4 IMPLANT
ELECT BLADE 4.0 EZ CLEAN MEGAD (MISCELLANEOUS)
ELECT REM PT RETURN 9FT ADLT (ELECTROSURGICAL) ×2
ELECTRODE BLDE 4.0 EZ CLN MEGD (MISCELLANEOUS) IMPLANT
ELECTRODE REM PT RTRN 9FT ADLT (ELECTROSURGICAL) ×1 IMPLANT
EVACUATOR SILICONE 100CC (DRAIN) ×2 IMPLANT
GAUZE SPONGE 4X4 12PLY STRL (GAUZE/BANDAGES/DRESSINGS) ×2 IMPLANT
GLOVE SURG ENC MOIS LTX SZ6 (GLOVE) ×2 IMPLANT
GLOVE SURG UNDER POLY LF SZ6.5 (GLOVE) ×2 IMPLANT
GOWN STRL REUS W/ TWL LRG LVL3 (GOWN DISPOSABLE) ×1 IMPLANT
GOWN STRL REUS W/TWL 2XL LVL3 (GOWN DISPOSABLE) ×2 IMPLANT
GOWN STRL REUS W/TWL LRG LVL3 (GOWN DISPOSABLE) ×2
LIGHT WAVEGUIDE WIDE FLAT (MISCELLANEOUS) IMPLANT
NDL SAFETY ECLIPSE 18X1.5 (NEEDLE) ×1 IMPLANT
NEEDLE HYPO 18GX1.5 SHARP (NEEDLE) ×2
NEEDLE HYPO 25X1 1.5 SAFETY (NEEDLE) ×2 IMPLANT
NEEDLE SPNL 18GX3.5 QUINCKE PK (NEEDLE) IMPLANT
NEEDLE SPNL 22GX3.5 QUINCKE BK (NEEDLE) IMPLANT
NS IRRIG 1000ML POUR BTL (IV SOLUTION) ×2 IMPLANT
PACK BASIN DAY SURGERY FS (CUSTOM PROCEDURE TRAY) ×2 IMPLANT
PACK UNIVERSAL I (CUSTOM PROCEDURE TRAY) ×2 IMPLANT
PENCIL SMOKE EVACUATOR (MISCELLANEOUS) ×2 IMPLANT
PIN SAFETY STERILE (MISCELLANEOUS) ×2 IMPLANT
SLEEVE SCD COMPRESS KNEE MED (STOCKING) ×2 IMPLANT
SPONGE T-LAP 18X18 ~~LOC~~+RFID (SPONGE) ×2 IMPLANT
STAPLER VISISTAT 35W (STAPLE) IMPLANT
STOCKINETTE IMPERVIOUS LG (DRAPES) IMPLANT
STRIP CLOSURE SKIN 1/2X4 (GAUZE/BANDAGES/DRESSINGS) ×2 IMPLANT
SUT ETHILON 2 0 FS 18 (SUTURE) IMPLANT
SUT ETHILON 3 0 PS 1 (SUTURE) ×2 IMPLANT
SUT MNCRL AB 4-0 PS2 18 (SUTURE) ×8 IMPLANT
SUT SILK 0 TIES 10X30 (SUTURE) ×2 IMPLANT
SUT SILK 2 0 SH (SUTURE) IMPLANT
SUT VICRYL 3-0 CR8 SH (SUTURE) ×8 IMPLANT
SUT VICRYL AB 2 0 TIE (SUTURE) ×1 IMPLANT
SUT VICRYL AB 2 0 TIES (SUTURE) ×2
SYR BULB IRRIG 60ML STRL (SYRINGE) ×2 IMPLANT
SYR CONTROL 10ML LL (SYRINGE) ×2 IMPLANT
TOWEL GREEN STERILE FF (TOWEL DISPOSABLE) ×2 IMPLANT
TRACER MAGTRACE VIAL (MISCELLANEOUS) IMPLANT
TUBE CONNECTING 20X1/4 (TUBING) ×2 IMPLANT
UNDERPAD 30X36 HEAVY ABSORB (UNDERPADS AND DIAPERS) ×2 IMPLANT
YANKAUER SUCT BULB TIP NO VENT (SUCTIONS) ×2 IMPLANT

## 2021-09-09 NOTE — Anesthesia Postprocedure Evaluation (Signed)
Anesthesia Post Note  Patient: Bianca Fletcher  Procedure(s) Performed: LEFT MASTECTOMY WITH SENTINEL LYMPH NODE BIOPSY (Left: Breast)     Patient location during evaluation: PACU Anesthesia Type: General Level of consciousness: awake and alert and oriented Pain management: pain level controlled Vital Signs Assessment: post-procedure vital signs reviewed and stable Respiratory status: spontaneous breathing, nonlabored ventilation and respiratory function stable Cardiovascular status: blood pressure returned to baseline and stable Postop Assessment: no apparent nausea or vomiting Anesthetic complications: no   No notable events documented.  Last Vitals:  Vitals:   09/09/21 1515 09/09/21 1530  BP: 122/73 117/65  Pulse: 61 62  Resp: 12 13  Temp:    SpO2: 94% 94%    Last Pain:  Vitals:   09/09/21 1515  TempSrc:   PainSc: Asleep                 Calvary Difranco A.

## 2021-09-09 NOTE — Interval H&P Note (Signed)
History and Physical Interval Note:  09/09/2021 11:49 AM  Bianca Fletcher  has presented today for surgery, with the diagnosis of LEFT BREAST CANCER.  The various methods of treatment have been discussed with the patient and family. After consideration of risks, benefits and other options for treatment, the patient has consented to  Procedure(s): LEFT MASTECTOMY WITH SENTINEL LYMPH NODE BIOPSY (Left) as a surgical intervention.  The patient's history has been reviewed, patient examined, no change in status, stable for surgery.  I have reviewed the patient's chart and labs.  Questions were answered to the patient's satisfaction.     Stark Klein

## 2021-09-09 NOTE — Anesthesia Preprocedure Evaluation (Signed)
Anesthesia Evaluation  Patient identified by MRN, date of birth, ID band Patient awake    Reviewed: Allergy & Precautions, NPO status , Patient's Chart, lab work & pertinent test results, reviewed documented beta blocker date and time   Airway Mallampati: II  TM Distance: >3 FB Neck ROM: Full    Dental no notable dental hx. (+) Teeth Intact, Dental Advisory Given, Caps   Pulmonary former smoker,    Pulmonary exam normal breath sounds clear to auscultation       Cardiovascular negative cardio ROS Normal cardiovascular exam Rhythm:Regular Rate:Normal     Neuro/Psych  Headaches, PSYCHIATRIC DISORDERS Anxiety Depression    GI/Hepatic GERD  Medicated and Controlled,  Endo/Other  Morbid obesityLeft Breast Ca Hyperlipidemia  Renal/GU   negative genitourinary   Musculoskeletal negative musculoskeletal ROS (+)   Abdominal (+) + obese,   Peds  Hematology negative hematology ROS (+)   Anesthesia Other Findings   Reproductive/Obstetrics                             Anesthesia Physical Anesthesia Plan  ASA: 3  Anesthesia Plan: General   Post-op Pain Management: Regional block   Induction:   PONV Risk Score and Plan: 4 or greater and Treatment may vary due to age or medical condition, Midazolam, Ondansetron, Dexamethasone and Scopolamine patch - Pre-op  Airway Management Planned: LMA  Additional Equipment:   Intra-op Plan:   Post-operative Plan: Extubation in OR  Informed Consent: I have reviewed the patients History and Physical, chart, labs and discussed the procedure including the risks, benefits and alternatives for the proposed anesthesia with the patient or authorized representative who has indicated his/her understanding and acceptance.     Dental advisory given  Plan Discussed with: CRNA and Anesthesiologist  Anesthesia Plan Comments:         Anesthesia Quick  Evaluation

## 2021-09-09 NOTE — Anesthesia Procedure Notes (Signed)
Procedure Name: LMA Insertion Date/Time: 09/09/2021 12:37 PM Performed by: Willa Frater, CRNA Pre-anesthesia Checklist: Patient identified, Emergency Drugs available, Suction available and Patient being monitored Patient Re-evaluated:Patient Re-evaluated prior to induction Oxygen Delivery Method: Circle system utilized Preoxygenation: Pre-oxygenation with 100% oxygen Induction Type: IV induction Ventilation: Mask ventilation without difficulty LMA: LMA inserted LMA Size: 5.0 Number of attempts: 1 Airway Equipment and Method: Bite block Placement Confirmation: positive ETCO2 Tube secured with: Tape Dental Injury: Teeth and Oropharynx as per pre-operative assessment

## 2021-09-09 NOTE — Progress Notes (Signed)
Assisted Dr. Royce Macadamia with left, ultrasound guided, pectoralis block. Side rails up, monitors on throughout procedure. See vital signs in flow sheet. Tolerated Procedure well.

## 2021-09-09 NOTE — Op Note (Signed)
Left Mastectomy with Sentinel Node Biopsy Procedure Note  Indications: This patient presents with history of left breast cancer.  She previously had right breast cancer and required a right mastectomy years ago.  She desires left mastectomy for symmetry  Pre-operative Diagnosis: left breast cancer, cTis, upper outer quadrant, receptors +/-  Post-operative Diagnosis: same  Surgeon: Stark Klein   Anesthesia: General endotracheal anesthesia and pectoral block  ASA Class: 3  Procedure Details  The patient was seen in the Holding Room. The risks, benefits, complications, treatment options, and expected outcomes were discussed with the patient. The possibilities of reaction to medication, pulmonary aspiration, bleeding, infection, the need for additional procedures, failure to diagnose a condition, and creating a complication requiring transfusion or operation were discussed with the patient. The patient concurred with the proposed plan, giving informed consent.  The site of surgery properly noted/marked. The patient was taken to Operating Room # 8, identified as Gracynn Rajewski and the procedure verified as left Mastectomy and Sentinel Node Biopsy. A Time Out was held and the above information confirmed.  The MagTrace was injected in the subareolar location.    After induction of anesthesia, the left arm, breast, and chest were prepped and draped in standard fashion.  The borders of the breast were identified and marked.  The incisions of the breast were drawn out to make sure incision lines were equidistant in length.    The superior incision was made with the #10 blade.  Mastectomy hooks were used to provide elevation of the skin edges, and the cautery was used to create the mastectomy flaps.  The dissection was taken to the fascia of the pectoralis major.  The penetrating vessels were clipped.  The superior flap was taken medially to the lateral sternal border, superiorly to the inferior border of the  clavicle.  The inferior flap was similarly created, inferiorly to the inframammary fold and laterally to the border of the latissimus.  The breast was taken off including the pectoralis fascia and the axillary tail marked.    Using a hand-held Sentimag probe, axillary sentinel nodes were identified.  Three deep level 2 axillary sentinel nodes were removed and submitted to pathology.  The findings are below.  The lymphovascular channels were clipped with metal clips.        The wound was irrigated. One 19 Blake drain was placed laterally.   Hemostasis was achieved with cautery.  The wound was irrigated and closed with a 3-0 Vicryl deep dermal interrupted sutures and 4-0 Vicryl subcuticular closure in layers.    Sterile dressings were applied. At the end of the operation, all sponge, instrument, and needle counts were correct.  Findings: grossly clear surgical margins, peak mag trace 2290, background was 19  Estimated Blood Loss: <25 mL          Drains: 19 Fr blake drain in left axilla                Specimens: left breast and three left axillary sentinel nodes         Complications:  None; patient tolerated the procedure well.         Disposition: PACU - hemodynamically stable.         Condition: stable

## 2021-09-09 NOTE — Discharge Instructions (Addendum)
[facility] [place] [phone]  MASTECTOMY: POST OP INSTRUCTIONS  Always review your discharge instruction sheet given to you by the facility where your surgery was performed. IF YOU HAVE DISABILITY OR FAMILY LEAVE FORMS, YOU MUST BRING THEM TO THE OFFICE FOR PROCESSING.   DO NOT GIVE THEM TO YOUR DOCTOR. A prescription for pain medication may be given to you upon discharge.  Take your pain medication as prescribed, if needed.  If narcotic pain medicine is not needed, then you may take acetaminophen (Tylenol) or ibuprofen (Advil) as needed. Take your usually prescribed medications unless otherwise directed. If you need a refill on your pain medication, please contact your pharmacy.  They will contact our office to request authorization.  Prescriptions will not be filled after 5pm or on week-ends. You should follow a light diet the first few days after arrival home, such as soup and crackers, etc.  Resume your normal diet the day after surgery. Most patients will experience some swelling and bruising on the chest and underarm.  Ice packs will help.  Swelling and bruising can take several days to resolve.  It is common to experience some constipation if taking pain medication after surgery.  Increasing fluid intake and taking a stool softener (such as Colace) will usually help or prevent this problem from occurring.  A mild laxative (Milk of Magnesia or Miralax) should be taken according to package instructions if there are no bowel movements after 48 hours. Unless discharge instructions indicate otherwise, leave your bandage dry and in place until your next appointment in 3-5 days.  You may take a limited sponge bath.  No tube baths or showers until the drains are removed.  You may have steri-strips (small skin tapes) in place directly over the incision.  These strips should be left on the skin for 7-10 days.  If your surgeon used skin glue on the incision, you may shower in 24 hours.   The glue will flake off over the next 2-3 weeks.  Any sutures or staples will be removed at the office during your follow-up visit. DRAINS:  If you have drains in place, it is important to keep a list of the amount of drainage produced each day in your drains.  Before leaving the hospital, you should be instructed on drain care.  Call our office if you have any questions about your drains. ACTIVITIES:  You may resume regular (light) daily activities beginning the next day--such as daily self-care, walking, climbing stairs--gradually increasing activities as tolerated.  You may have sexual intercourse when it is comfortable.  Refrain from any heavy lifting or straining until approved by your doctor. You may drive when you are no longer taking prescription pain medication, you can comfortably wear a seatbelt, and you can safely maneuver your car and apply brakes. RETURN TO WORK:  __________________________________________________________ Dennis Bast should see your doctor in the office for a follow-up appointment approximately 3-5 days after your surgery.  Your doctor's nurse will typically make your follow-up appointment when she calls you with your pathology report.  Expect your pathology report 2-3 business days after your surgery.  You may call to check if you do not hear from [place] after three days.   OTHER INSTRUCTIONS: ______________________________________________________________________________________________ ____________________________________________________________________________________________ WHEN TO CALL YOUR DOCTOR: Fever over 101.0 Nausea and/or vomiting Extreme swelling or bruising Continued bleeding from incision. Increased pain, redness, or drainage from the incision. The clinic staff is available to answer your questions during regular business hours.  Please don't hesitate  to call and ask to speak to one of the nurses for clinical concerns.  If you have a medical emergency, go to the  nearest emergency room or call 911.  A surgeon from [facility] is always on call at the hospital. [address] [place], [place], [place]  [address] ? [address], [place], [place]   [address] [phone] ? [phone] ? FAX [phone] Web site: [email or web address]  Please check to see if your drains need to be emptied every 2-4 hours and then if the drain needs to be emptied, look at the video Clarise Cruz took on your phone or instructions on discharge instructions to empty the drain.  Please record the date, time and the amount of drainage.    About my Jackson-Pratt Bulb Drain  What is a Jackson-Pratt bulb? A Jackson-Pratt is a soft, round device used to collect drainage. It is connected to a long, thin drainage catheter, which is held in place by one or two small stiches near your surgical incision site. When the bulb is squeezed, it forms a vacuum, forcing the drainage to empty into the bulb.  Emptying the Jackson-Pratt bulb- To empty the bulb: 1. Release the plug on the top of the bulb. 2. Pour the bulb's contents into a measuring container which your nurse will provide. 3. Record the time emptied and amount of drainage. Empty the drain(s) as often as your     doctor or nurse recommends.  Date                  Time                    Amount (Drain 1)                 Amount (Drain 2)  _____________________________________________________________________  _____________________________________________________________________  _____________________________________________________________________  _____________________________________________________________________  _____________________________________________________________________  _____________________________________________________________________  _____________________________________________________________________  _____________________________________________________________________  Squeezing the  Jackson-Pratt Bulb- To squeeze the bulb: 1. Make sure the plug at the top of the bulb is open. 2. Squeeze the bulb tightly in your fist. You will hear air squeezing from the bulb. 3. Replace the plug while the bulb is squeezed. 4. Use a safety pin to attach the bulb to your clothing. This will keep the catheter from     pulling at the bulb insertion site.  When to call your doctor- Call your doctor if: Drain site becomes red, swollen or hot. You have a fever greater than 101 degrees F. There is oozing at the drain site. Drain falls out (apply a guaze bandage over the drain hole and secure it with tape). Drainage increases daily not related to activity patterns. (You will usually have more drainage when you are active than when you are resting.) Drainage has a bad odor.

## 2021-09-09 NOTE — Transfer of Care (Signed)
Immediate Anesthesia Transfer of Care Note  Patient: Bianca Fletcher  Procedure(s) Performed: LEFT MASTECTOMY WITH SENTINEL LYMPH NODE BIOPSY (Left: Breast)  Patient Location: PACU  Anesthesia Type:GA combined with regional for post-op pain  Level of Consciousness: awake, drowsy and patient cooperative  Airway & Oxygen Therapy: Patient Spontanous Breathing and Patient connected to face mask oxygen  Post-op Assessment: Report given to RN and Post -op Vital signs reviewed and stable  Post vital signs: Reviewed and stable  Last Vitals:  Vitals Value Taken Time  BP    Temp    Pulse 77 09/09/21 1426  Resp    SpO2 94 % 09/09/21 1426  Vitals shown include unvalidated device data.  Last Pain:  Vitals:   09/09/21 1027  TempSrc: Oral  PainSc: 0-No pain      Patients Stated Pain Goal: 3 (80/32/12 2482)  Complications: No notable events documented.

## 2021-09-09 NOTE — Discharge Planning (Signed)
RNCM consulted regarding home health services.  RNCM will refer pt to area home health agencies and provide choices as they become available.

## 2021-09-09 NOTE — Anesthesia Procedure Notes (Addendum)
Anesthesia Regional Block: Pectoralis block   Pre-Anesthetic Checklist: , timeout performed,  Correct Patient, Correct Site, Correct Laterality,  Correct Procedure, Correct Position, site marked,  Risks and benefits discussed,  Surgical consent,  Pre-op evaluation,  At surgeon's request and post-op pain management  Laterality: Left  Prep: chloraprep       Needles:  Injection technique: Single-shot      Needle Length: 10cm  Needle Gauge: 21   Needle insertion depth: 8.5 cm   Additional Needles:   Procedures:,,,, ultrasound used (permanent image in chart),,    Narrative:  Start time: 09/09/2021 10:45 AM End time: 09/09/2021 10:50 AM Injection made incrementally with aspirations every 5 mL.  Performed by: Personally  Anesthesiologist: Josephine Igo, MD  Additional Notes: Timeout performed. Patient sedated. Relevant anatomy ID'd using [place]. Incremental 2-69ml injection of LA with frequent aspiration. Patient tolerated procedure well.    Left Pectoralis Block

## 2021-09-10 ENCOUNTER — Encounter (HOSPITAL_BASED_OUTPATIENT_CLINIC_OR_DEPARTMENT_OTHER): Payer: Self-pay | Admitting: General Surgery

## 2021-09-10 ENCOUNTER — Other Ambulatory Visit: Payer: Self-pay

## 2021-09-10 ENCOUNTER — Emergency Department (HOSPITAL_COMMUNITY)
Admission: EM | Admit: 2021-09-10 | Discharge: 2021-09-11 | Disposition: A | Discharge status: Home / Self Care | Payer: Medicare Other | Source: Home / Self Care

## 2021-09-10 DIAGNOSIS — Z4889 Encounter for other specified surgical aftercare: Secondary | ICD-10-CM | POA: Insufficient documentation

## 2021-09-10 DIAGNOSIS — F79 Unspecified intellectual disabilities: Secondary | ICD-10-CM

## 2021-09-10 DIAGNOSIS — T819XXA Unspecified complication of procedure, initial encounter: Secondary | ICD-10-CM | POA: Insufficient documentation

## 2021-09-10 DIAGNOSIS — Z5321 Procedure and treatment not carried out due to patient leaving prior to being seen by health care provider: Secondary | ICD-10-CM | POA: Insufficient documentation

## 2021-09-10 NOTE — Addendum Note (Signed)
Addendum  created 09/10/21 0935 by Josephine Igo, MD   Clinical Note Signed, Intraprocedure Blocks edited, SmartForm saved

## 2021-09-10 NOTE — ED Triage Notes (Signed)
Patient BIB [facility] from home for evaluation of concern of her post surgical wound vac connected to wound on left chest, patient states drainage is less than she expected and less than it was the first day. PAtient alert, oriented, ambulatory, and in no apparent distress at this time.

## 2021-09-10 NOTE — ED Notes (Signed)
Pt here for clogged JP drain, not draining, after L mastectomy yesterday, per Ems it was clogged w/ tissue

## 2021-09-10 NOTE — Discharge Planning (Signed)
Cory with [facility] accepted referral for home health services.  Alvis Lemmings will visit pt this weekend to start services.

## 2021-09-11 NOTE — ED Notes (Signed)
Pt called multiple times no answer 

## 2021-09-13 LAB — SURGICAL PATHOLOGY

## 2021-09-16 ENCOUNTER — Encounter: Payer: Self-pay | Admitting: *Deleted

## 2021-09-26 ENCOUNTER — Emergency Department (HOSPITAL_COMMUNITY)
Admission: EM | Admit: 2021-09-26 | Discharge: 2021-09-26 | Disposition: A | Discharge status: Home / Self Care | Payer: Medicare Other | Attending: Emergency Medicine | Admitting: Emergency Medicine

## 2021-09-26 DIAGNOSIS — Z87891 Personal history of nicotine dependence: Secondary | ICD-10-CM | POA: Insufficient documentation

## 2021-09-26 DIAGNOSIS — Z853 Personal history of malignant neoplasm of breast: Secondary | ICD-10-CM | POA: Diagnosis not present

## 2021-09-26 DIAGNOSIS — Z4801 Encounter for change or removal of surgical wound dressing: Secondary | ICD-10-CM | POA: Diagnosis not present

## 2021-09-26 DIAGNOSIS — Z5189 Encounter for other specified aftercare: Secondary | ICD-10-CM

## 2021-09-26 MED ORDER — BACITRACIN ZINC 500 UNIT/GM EX OINT
TOPICAL_OINTMENT | Freq: Once | CUTANEOUS | Status: AC
  Administered 2021-09-26: 1 via TOPICAL
  Filled 2021-09-26: qty 0.9

## 2021-09-26 NOTE — ED Provider Notes (Signed)
[facility] DEPT Provider Note   CSN: [id number] Arrival date & time: 09/26/21  1158     History No chief complaint on file.   Bianca Fletcher is a 55 y.o. female with a PMH significant for breast cancer, with recent mastectomy earlier this month who presents for wound check, drain removal at her surgical site.  Patient reports that drain which was removed around a week ago, but it was having high output and so was left in place.  Patient reports that she needs a drain removed at this time.  Drain output has been significantly decreased.  Patient denies any pain, pus draining from the affected area, fever, chills.  HPI     Past Medical History:  Diagnosis Date   Breast cancer [facility]) 2003   right breast   Cancer ([place]) 2003   right breast-no other tx.   Depression    GERD (gastroesophageal reflux disease)    High cholesterol    Migraine     Patient Active Problem List   Diagnosis Date Noted   Intellectual disability 09/10/2021   Breast cancer, left ([place]) 09/09/2021   Ductal carcinoma in situ (DCIS) of left breast 08/05/2021   Depression, major, in remission ([place]) 02/09/2015   History of breast cancer 02/09/2015   Migraine without aura and without status migrainosus, not intractable 02/09/2015   Morbid obesity with body mass index of 40.0-44.9 in adult [facility]) 02/09/2015   Pure hypercholesterolemia 02/09/2015    Past Surgical History:  Procedure Laterality Date   BREAST BIOPSY Left 07/16/2021   stereo bx,x-clip, path pending   BREAST BIOPSY Left 07/29/2021   stereo bx, ribbon clip, path pending   BREAST SURGERY     MASTECTOMY Right 2003   positive   MASTECTOMY W/ SENTINEL NODE BIOPSY Left 09/09/2021   Procedure: LEFT MASTECTOMY WITH SENTINEL LYMPH NODE BIOPSY;  Surgeon: Stark Klein, MD;  Location: [facility];  Service: General;  Laterality: Left;   TONSILLECTOMY       OB History   No obstetric history on file.      Family History  Problem Relation Age of Onset   Alzheimer's disease Mother    Kidney disease Father    Ulcers Father    Gout Father    Breast cancer Paternal Grandmother    Colon cancer Neg Hx    Esophageal cancer Neg Hx    Rectal cancer Neg Hx    Stomach cancer Neg Hx     Social History   Tobacco Use   Smoking status: Former   Smokeless tobacco: Never  Scientific laboratory technician Use: Never used  Substance Use Topics   Alcohol use: No    Alcohol/week: 0.0 standard drinks   Drug use: No    Home Medications Prior to Admission medications   Medication Sig Start Date End Date Taking? Authorizing Provider  buPROPion (WELLBUTRIN XL) 150 MG 24 hr tablet Take 300 mg by mouth daily.    [provider]  clotrimazole (LOTRIMIN) 1 % cream Apply to affected area 2 times daily Patient taking differently: Apply 1 application topically daily as needed (infection). 02/02/20   Couture, Cortni S, PA-C  escitalopram (LEXAPRO) 10 MG tablet Take 10 mg by mouth daily. 03/09/16   [provider]  gabapentin (NEURONTIN) 100 MG capsule Take 1 capsule (100 mg total) by mouth 2 (two) times daily. 09/09/21   Stark Klein, MD  methocarbamol (ROBAXIN) 500 MG tablet Take 1 tablet (500 mg  total) by mouth every 6 (six) hours as needed for muscle spasms. 09/09/21   Stark Klein, MD  oxyCODONE (OXY IR/ROXICODONE) 5 MG immediate release tablet Take 1-2 tablets (5-10 mg total) by mouth every 4 (four) hours as needed for moderate pain. 09/09/21   Stark Klein, MD  propranolol ER (INDERAL LA) 80 MG 24 hr capsule Take 80 mg by mouth daily. 05/21/21   [provider]  rosuvastatin (CRESTOR) 20 MG tablet Take 20 mg by mouth daily. 04/15/21   [provider]  SUMAtriptan (IMITREX) 100 MG tablet Take 100 mg by mouth daily as needed for migraine. 02/20/21   [provider]  topiramate (TOPAMAX) 100 MG tablet Take 100 mg by mouth daily. 05/17/21   [provider]    Allergies     Amoxicillin and Penicillins  Review of Systems   Review of Systems  Skin:  Positive for wound.  All other systems reviewed and are negative.  Physical Exam Updated Vital Signs BP (!) 131/93 (BP Location: Right Arm)    Pulse 75    Temp 98.4 F (36.9 C) (Oral)    Resp 16    LMP 03/28/2019    SpO2 95%   Physical Exam Vitals and nursing note reviewed.  Constitutional:      General: She is not in acute distress.    Appearance: Normal appearance.  HENT:     Head: Normocephalic and atraumatic.  Eyes:     General:        Right eye: No discharge.        Left eye: No discharge.  Cardiovascular:     Rate and Rhythm: Normal rate and regular rhythm.  Pulmonary:     Effort: Pulmonary effort is normal. No respiratory distress.  Musculoskeletal:        General: No deformity.  Skin:    General: Skin is warm and dry.     Comments: Patient with expected appearance of surgical site, there are well-healing sutures with Steri-Strips in place at the mastectomy site, and there is a slightly red, appropriately healing drain site with suture in place on the inframammary line on the left.  There is some redness and irritation around the drain site, however there is no induration, abscess, purulent drainage.  There is appropriate granulation tissue seen.  Neurological:     Mental Status: She is alert and oriented to person, place, and time.  Psychiatric:        Mood and Affect: Mood normal.        Behavior: Behavior normal.    ED Results / Procedures / Treatments   Labs (all labs ordered are listed, but only abnormal results are displayed) Labs Reviewed - No data to display  EKG None  Radiology No results found.  Procedures .Suture Removal  Date/Time: 09/26/2021 1:21 PM Performed by: Anselmo Pickler, PA-C Authorized by: Anselmo Pickler, PA-C   Consent:    Consent obtained:  Verbal   Consent given by:  Patient   Risks, benefits, and alternatives were discussed: yes      Risks discussed:  Pain   Alternatives discussed:  No treatment Universal protocol:    Procedure explained and questions answered to patient or proxy's satisfaction: yes     Patient identity confirmed:  Verbally with patient Location:    Location:  [place]   Trunk location:  Breast   Breast location:  L breast Procedure details:    Wound appearance:  No signs of infection, red and  good wound healing   Number of sutures removed:  1 Post-procedure details:    Post-removal:  Antibiotic ointment applied and dressing applied   Procedure completion:  Tolerated   Medications Ordered in ED Medications  bacitracin ointment (1 application Topical Given 09/26/21 1315)    ED Course  I have reviewed the triage vital signs and the nursing notes.  Pertinent labs & imaging results that were available during my care of the patient were reviewed by me and considered in my medical decision making (see chart for details).    MDM Rules/Calculators/A&P                         I discussed this case with my attending physician who cosigned this note including patient's presenting symptoms, physical exam, and planned diagnostics and interventions. Attending physician stated agreement with plan or made changes to plan which were implemented.   Attending physician assessed patient at bedside.  Patient presents for wound check status postmastectomy, with drain still in place.  Drain was spontaneously removed in triage because it had become loose, and had had decreased output and was appropriate for removal at this time.  There is some redness and irritation around the drain site, however there is no evidence of cellulitis, induration, purulent drainage, abscess.  There are sutures still in place.  Sutures removed as described above.  Do not believe that this represents a surgical site infection, we will review bandage with bacitracin, and encourage patient to read bandage once daily, and follow-up with her  surgeon.  Return precautions given.  Patient discharged in stable condition at this time.   Final Clinical Impression(s) / ED Diagnoses Final diagnoses:  Visit for wound check    Rx / DC Orders ED Discharge Orders     None        Dorien Chihuahua 09/26/21 1321    Lacretia Leigh, MD 09/27/21 [phone]

## 2021-09-26 NOTE — Discharge Instructions (Signed)
We discussed your drainage tube was removed today, there is some redness and irritation, but there is no evidence of infection.  We are reevaluating the site with some antibacterial ointment.  I recommend that you change the bandage once daily and apply an antibacterial ointment such as Polysporin or Neosporin under the bandage when you change it.  Please follow-up with your surgical team to discuss further recommendations for wound care, suture removal, and further wound checks. Please return if you have worsening redness, pain, or pus draining from the surgical site. It was a pleasure taking care of you. I hope you have a merry christmas.

## 2021-09-26 NOTE — ED Triage Notes (Signed)
Per EMS-has a drain, which was placed after surgery, was suppose to have it out last week but missed her appointment-states she called someone who told her to come to ED to have tube removed-refused vitals with EMS

## 2021-09-26 NOTE — ED Notes (Signed)
Guardian made aware of pt's discharge.

## 2021-09-26 NOTE — ED Notes (Signed)
Suture removal kit at bedside. 

## 2021-09-26 NOTE — ED Provider Notes (Signed)
I provided a substantive portion of the care of this patient.  I personally performed the entirety of the medical decision making for this encounter.      55 year old female here for evaluation of breast surgical drainage.  No evidence of infection around the area.  Her incisions are intact.  Patient follow-up with her surgeon   Lacretia Leigh, MD 09/26/21 1308

## 2021-10-05 NOTE — Progress Notes (Signed)
Patient Care Team: Tisovec, Fransico Him, MD as PCP - General (Internal Medicine) Mauro Kaufmann, RN as Oncology Nurse Navigator Rockwell Germany, RN as Oncology Nurse Navigator  DIAGNOSIS:    ICD-10-CM   1. Ductal carcinoma in situ (DCIS) of left breast  D05.12       SUMMARY OF ONCOLOGIC HISTORY: Oncology History  Ductal carcinoma in situ (DCIS) of left breast  07/16/2021 Initial Diagnosis   Screening mammogram detected left breast calcifications measuring 3.4 cm  Stereotactic biopsy of Left breast UOQ calcifications: Intermediate grade DCIS with calcifications and necrosis ER 90 to 100% strong staining  Additional left breast calcifications; stereotactic biopsy: Benign breast tissue with fibrocystic and sclerosing adenosis   08/10/2021 Cancer Staging   Staging form: Breast, AJCC 8th Edition - Clinical stage from 08/10/2021: Stage 0 (cTis (DCIS), cN0, cM0, ER+, PR: Not Assessed, HER2: Not Assessed) - Signed by Nicholas Lose, MD on 08/18/2021 Stage prefix: Initial diagnosis    09/09/2021 Surgery   Left mastectomy: No evidence of residual DCIS, 0/4 lymph nodes, ER 91 200%, PR not performed     CHIEF COMPLIANT: Follow-up of left breast cancer  INTERVAL HISTORY: Bianca Fletcher is a 56 y.o. with above-mentioned history of left breast cancer. Left mastectomy on 09/09/2021 showed no residual DCIS and lymph nodes negative for carcinoma. She presents to the clinic today for follow-up.  She is still healing and recovering from mastectomy.  She still has Steri-Strips.  She has a home health nurse coming up and helping her.  ALLERGIES:  is allergic to amoxicillin and penicillins.  MEDICATIONS:  Current Outpatient Medications  Medication Sig Dispense Refill   buPROPion (WELLBUTRIN XL) 150 MG 24 hr tablet Take 300 mg by mouth daily.     clotrimazole (LOTRIMIN) 1 % cream Apply to affected area 2 times daily (Patient taking differently: Apply 1 application topically daily as needed  (infection).) 15 g 0   escitalopram (LEXAPRO) 10 MG tablet Take 10 mg by mouth daily.     gabapentin (NEURONTIN) 100 MG capsule Take 1 capsule (100 mg total) by mouth 2 (two) times daily. 30 capsule 0   methocarbamol (ROBAXIN) 500 MG tablet Take 1 tablet (500 mg total) by mouth every 6 (six) hours as needed for muscle spasms. 20 tablet 0   oxyCODONE (OXY IR/ROXICODONE) 5 MG immediate release tablet Take 1-2 tablets (5-10 mg total) by mouth every 4 (four) hours as needed for moderate pain. 20 tablet 0   propranolol ER (INDERAL LA) 80 MG 24 hr capsule Take 80 mg by mouth daily.     rosuvastatin (CRESTOR) 20 MG tablet Take 20 mg by mouth daily.     SUMAtriptan (IMITREX) 100 MG tablet Take 100 mg by mouth daily as needed for migraine.     topiramate (TOPAMAX) 100 MG tablet Take 100 mg by mouth daily.     Current Facility-Administered Medications  Medication Dose Route Frequency Provider Last Rate Last Admin   0.9 %  sodium chloride infusion  500 mL Intravenous Once Armbruster, Carlota Raspberry, MD        PHYSICAL EXAMINATION: ECOG PERFORMANCE STATUS: 1 - Symptomatic but completely ambulatory  Vitals:   10/06/21 1219  BP: 114/79  Pulse: 71  Resp: 18  SpO2: 94%   Filed Weights   10/06/21 1219  Weight: 261 lb 4.8 oz (118.5 kg)     LABORATORY DATA:  I have reviewed the data as listed CMP Latest Ref Rng & Units 05/29/2021  Glucose 70 -  99 mg/dL 100(H)  BUN 6 - 20 mg/dL 15  Creatinine 0.44 - 1.00 mg/dL 0.84  Sodium 135 - 145 mmol/L 139  Potassium 3.5 - 5.1 mmol/L 3.9  Chloride 98 - 111 mmol/L 108  CO2 22 - 32 mmol/L 25  Calcium 8.9 - 10.3 mg/dL 9.0  Total Protein 6.5 - 8.1 g/dL 7.1  Total Bilirubin 0.3 - 1.2 mg/dL 0.6  Alkaline Phos 38 - 126 U/L 95  AST 15 - 41 U/L 23  ALT 0 - 44 U/L 25    Lab Results  Component Value Date   WBC 9.0 05/29/2021   HGB 13.0 05/29/2021   HCT 41.2 05/29/2021   MCV 88.6 05/29/2021   PLT 279 05/29/2021    ASSESSMENT & PLAN:  Ductal carcinoma in situ  (DCIS) of left breast 07/16/2021: Screening mammogram detected left breast calcifications measuring 3.4 cm  Stereotactic biopsy of Left breast UOQ calcifications: Intermediate grade DCIS with calcifications and necrosis ER 90 to 100% strong staining 09/09/2021:Left mastectomy: No evidence of residual DCIS, 0/4 lymph nodes, ER 91 200%, PR not performed (Patient had right mastectomy 20 years ago)  Pathology counseling: I discussed the final pathology report of the patient provided  a copy of this report. I discussed the margins.  We also discussed the final staging along with previously performed ER testing.  Treatment plan: No role of antiestrogen therapy since she now has bilateral mastectomies. Return to clinic on an as-needed basis.    No orders of the defined types were placed in this encounter.  The patient has a good understanding of the overall plan. she agrees with it. she will call with any problems that may develop before the next visit here.  Total time spent: 20 mins including face to face time and time spent for planning, charting and coordination of care  Rulon Eisenmenger, MD, MPH 10/06/2021  I, Thana Ates, am acting as scribe for Dr. Nicholas Lose.  I have reviewed the above documentation for accuracy and completeness, and I agree with the above.

## 2021-10-06 ENCOUNTER — Other Ambulatory Visit: Payer: Self-pay

## 2021-10-06 ENCOUNTER — Inpatient Hospital Stay: Payer: Commercial Managed Care - HMO | Attending: Hematology and Oncology | Admitting: Hematology and Oncology

## 2021-10-06 DIAGNOSIS — Z79899 Other long term (current) drug therapy: Secondary | ICD-10-CM | POA: Insufficient documentation

## 2021-10-06 DIAGNOSIS — D0512 Intraductal carcinoma in situ of left breast: Secondary | ICD-10-CM | POA: Insufficient documentation

## 2021-10-06 DIAGNOSIS — Z170 Estrogen receptor positive status [ER+]: Secondary | ICD-10-CM | POA: Insufficient documentation

## 2021-10-06 DIAGNOSIS — Z9012 Acquired absence of left breast and nipple: Secondary | ICD-10-CM | POA: Insufficient documentation

## 2021-10-06 NOTE — Assessment & Plan Note (Signed)
07/16/2021: Screening mammogram detected left breast calcifications measuring 3.4 cm  Stereotactic biopsy of Left breast UOQ calcifications: Intermediate grade DCIS with calcifications and necrosis ER 90 to 100% strong staining 09/09/2021:Left mastectomy: No evidence of residual DCIS, 0/4 lymph nodes, ER 91 200%, PR not performed (Patient had right mastectomy 20 years ago)  Pathology counseling: I discussed the final pathology report of the patient provided  a copy of this report. I discussed the margins.  We also discussed the final staging along with previously performed ER testing.  Treatment plan: No role of antiestrogen therapy since she now has bilateral mastectomies. Return to clinic on an as-needed basis.

## 2021-10-07 ENCOUNTER — Encounter: Payer: Self-pay | Admitting: *Deleted

## 2021-10-12 DIAGNOSIS — Z9013 Acquired absence of bilateral breasts and nipples: Secondary | ICD-10-CM | POA: Insufficient documentation

## 2021-10-28 ENCOUNTER — Other Ambulatory Visit: Payer: Self-pay

## 2021-10-28 ENCOUNTER — Encounter: Payer: Self-pay | Admitting: Rehabilitation

## 2021-10-28 ENCOUNTER — Ambulatory Visit: Payer: Commercial Managed Care - HMO | Attending: General Surgery | Admitting: Rehabilitation

## 2021-10-28 DIAGNOSIS — M25612 Stiffness of left shoulder, not elsewhere classified: Secondary | ICD-10-CM | POA: Diagnosis not present

## 2021-10-28 DIAGNOSIS — Z483 Aftercare following surgery for neoplasm: Secondary | ICD-10-CM | POA: Insufficient documentation

## 2021-10-28 NOTE — Patient Instructions (Signed)
Access Code: [email or web address]  URL: [email or web address]: 01/26/2023Prepared by: Marcene Brawn TevisExercises  Seated Shoulder Flexion AAROM with Pulley Behind - 1 x daily - 7 x weekly - 1 sets - 5 secon hold  Seated Shoulder Abduction AAROM with Pulley Behind - 1 x daily - 7 x weekly - 1 sets - 5 second hold  Supine Shoulder Flexion AAROM with Hands Clasped - 1 x daily - 7 x weekly - 1 sets - 10 reps - 5 second hold  Supine Chest Stretch with Elbows Bent - 1 x daily - 7 x weekly - 1 sets - 3 reps - 30-60seconds hold  Standing Shoulder Flexion Wall Walk - 1 x daily - 7 x weekly - 1 sets - 10 reps - 5 secon hold

## 2021-10-28 NOTE — Therapy (Signed)
[facility] @ [place], [place], [address] Phone: [phone]   Fax:  [phone]  Physical Therapy Evaluation  Patient Details  Name: Bianca Fletcher MRN: [id number] Date of Birth: 06/28/1966 Referring Provider (PT): Dr. Barry Dienes   Encounter Date: 10/28/2021   PT End of Session - 10/28/21 1207     Visit Number 1    Number of Visits 4    Date for PT Re-Evaluation 12/23/21    PT Start Time 1100    PT Stop Time 1159    PT Time Calculation (min) 59 min    Activity Tolerance Patient tolerated treatment well    Behavior During Therapy [facility] for tasks assessed/performed             Past Medical History:  Diagnosis Date   Breast cancer ([place]) 2003   right breast   Cancer ([place]) 2003   right breast-no other tx.   Depression    GERD (gastroesophageal reflux disease)    High cholesterol    Migraine     Past Surgical History:  Procedure Laterality Date   BREAST BIOPSY Left 07/16/2021   stereo bx,x-clip, path pending   BREAST BIOPSY Left 07/29/2021   stereo bx, ribbon clip, path pending   BREAST SURGERY     MASTECTOMY Right 2003   positive   MASTECTOMY W/ SENTINEL NODE BIOPSY Left 09/09/2021   Procedure: LEFT MASTECTOMY WITH SENTINEL LYMPH NODE BIOPSY;  Surgeon: Stark Klein, MD;  Location: [facility];  Service: General;  Laterality: Left;   TONSILLECTOMY      There were no vitals filed for this visit.    Subjective Assessment - 10/28/21 1100     Subjective I am having Lt shoulder trouble.  It started after mowing the ditch. and this was before surgery.  I was told I have bursitis and had an injection which helped a bit. (power of attorney present/caregiver who stated she did have shoulder problems but is stiffer now after surgery)    Patient is accompained by: --   power of attorney - Sarah   Pertinent History Lt Mastectomy 09/09/21 0/4 lymph nodes.  No radiation needed.  History of Rt  Mastectomy 20 years ago. Other hx: intellectual disability, high cholesterol    Limitations Lifting    Patient Stated Goals I can't do my hair like usual    Currently in Pain? No/denies   just sore with movement               [facility] PT Assessment - 10/28/21 0001       Assessment   Medical Diagnosis Left breast cancer    Referring Provider (PT) Dr. Barry Dienes    Onset Date/Surgical Date 09/09/21    Hand Dominance Right    Next MD Visit Monday    Prior Therapy no      Precautions   Precaution Comments lymphedema Lt UE      Balance Screen   Has the patient had a decrease in activity level because of a fear of falling?  No    Is the patient reluctant to leave their home because of a fear of falling?  No      Home Environment   Living Environment Private residence    Living Arrangements Alone    Type of [place] Access Level entry    [place] One level      Prior Function   Level of  Independence Independent    Vocation On disability    Leisure nothing      Observation/Other Assessments   Observations well healed incision with some increased pore size inferior to incision but not overly swollen.  Pt was encouraged to get compression bra which she has information for already from MD.  Excess skin bil axillae post mastectomy      Posture/Postural Control   Posture/Postural Control Postural limitations    Postural Limitations Rounded Shoulders;Forward head      ROM / Strength   AROM / PROM / Strength AROM      AROM   AROM Assessment Site Shoulder    Right/Left Shoulder Right;Left    Right Shoulder Extension 45 Degrees    Right Shoulder Flexion 165 Degrees    Right Shoulder ABduction 150 Degrees    Right Shoulder External Rotation 85 Degrees    Left Shoulder Extension 35 Degrees    Left Shoulder Flexion 137 Degrees   135   Left Shoulder ABduction 125 Degrees   pull   Left Shoulder External Rotation 70 Degrees               LYMPHEDEMA/ONCOLOGY  QUESTIONNAIRE - 10/28/21 0001       Type   Cancer Type Lt breast cancer - Rt history no LN removed      Surgeries   Mastectomy Date 09/10/22    Sentinel Lymph Node Biopsy Date 09/10/21    Number Lymph Nodes Removed 4      Treatment   Active Chemotherapy Treatment No    Past Chemotherapy Treatment No    Active Radiation Treatment No    Past Radiation Treatment No    Current Hormone Treatment No    Past Hormone Therapy No      Lymphedema Assessments   Lymphedema Assessments Upper extremities      Right Upper Extremity Lymphedema   15 cm Proximal to Olecranon Process 45 cm    Olecranon Process 29 cm    10 cm Proximal to Ulnar Styloid Process 26 cm    Just Proximal to Ulnar Styloid Process 18.5 cm    Across Hand at [employer] 20 cm    At [place] of 2nd Digit 6.8 cm      Left Upper Extremity Lymphedema   15 cm Proximal to Olecranon Process 47.5 cm    Olecranon Process 30 cm    10 cm Proximal to Ulnar Styloid Process 16 cm    Just Proximal to Ulnar Styloid Process 18.5 cm    Across Hand at [employer] 20.5 cm    At [place] of 2nd Digit 6.8 cm    Other pt reports "my left arm has always been bigger"             L-DEX FLOWSHEETS - 10/28/21 1200       L-DEX LYMPHEDEMA SCREENING   Measurement Type Unilateral    L-DEX MEASUREMENT EXTREMITY Upper Extremity    POSITION  Standing    DOMINANT SIDE Right    At Risk Side Left                    Objective measurements completed on examination: See above findings.                PT Education - 10/28/21 1206     Education Details stretches to start, how pull is okay to feel, importance of starting to move the UE    Person(s) Educated Patient;Other (  comment)    Methods Explanation;Demonstration;Tactile cues;Verbal cues;Handout    Comprehension Verbalized understanding;Returned demonstration                 PT Long Term Goals - 10/28/21 1317       PT LONG TERM GOAL #1   Title Pt will  increase Lt shoulder flexion and abduction to at least 150 degrees    Baseline Flex: 137, Abd: 125    Time 8    Period Weeks    Status New      PT LONG TERM GOAL #2   Title Pt will be ind with final HEP    Time 8    Period Weeks    Status New                    Plan - 10/28/21 1208     Clinical Impression Statement Pt presents with complaints of decreased Lt shoulder ROM post mastectomy with history of Lt shoulder bursitis previously.  Pt has intellectual disability and is a moderate to good historian with the legal guardian having to fill in some of the details.  Pt is limited in AROM on evaluation but more of a fear of movement and thinking she can move the arm.  After moving the arm and performing stretches pt demonstrates the ability to move the arm further than on measurement.  Due to schedule and situation pt will attempt HEP x 2 weeks and then check in to see if she is making improvements.  Guardian will order pulleys and encourage pt to be more active.Pt is unable to really answer questions regarding swelling so LDEX was performed today which is 4.2 and although not diagnostic can show no active lymphedema.    Personal Factors and Comorbidities Behavior Pattern;Comorbidity 2    Comorbidities bil Mastectomy    Examination-Activity Limitations Lift;Reach Overhead    Examination-Participation Restrictions Other;Cleaning    Stability/Clinical Decision Making Stable/Uncomplicated    Clinical Decision Making Low    Rehab Potential Good    PT Frequency Biweekly    PT Duration 8 weeks    PT Treatment/Interventions ADLs/Self Care Home Management;Compression bandaging;Manual lymph drainage;Patient/family education;Manual techniques;Taping;Therapeutic exercise    PT Next Visit Plan recheck AROM, make any changes to HEP? PROM/other activities as needed - pt interested in band TE    Consulted and Agree with Plan of Care Patient;Other (Comment)             Patient will benefit  from skilled therapeutic intervention in order to improve the following deficits and impairments:  Decreased range of motion, Impaired flexibility  Visit Diagnosis: Aftercare following surgery for neoplasm  Stiffness of left shoulder, not elsewhere classified     Problem List Patient Active Problem List   Diagnosis Date Noted   Intellectual disability 09/10/2021   Breast cancer, left ([place]) 09/09/2021   Ductal carcinoma in situ (DCIS) of left breast 08/05/2021   Depression, major, in remission ([place]) 02/09/2015   History of breast cancer 02/09/2015   Migraine without aura and without status migrainosus, not intractable 02/09/2015   Morbid obesity with body mass index of 40.0-44.9 in adult [facility]) 02/09/2015   Pure hypercholesterolemia 02/09/2015    Stark Bray, PT 10/28/2021, 1:19 PM  [facility] @ [place], [place], [address] Phone: [phone]   Fax:  [phone]  Name: ONEITA ALLMON MRN: [id number] Date of Birth: May 26, 1966

## 2021-12-08 ENCOUNTER — Telehealth: Payer: Self-pay | Admitting: Adult Health

## 2021-12-08 NOTE — Telephone Encounter (Signed)
.  Called patient to schedule appointment per 1/31 inb, patient is aware of date and time.   ?

## 2021-12-09 ENCOUNTER — Encounter: Payer: Self-pay | Admitting: Rehabilitation

## 2021-12-09 ENCOUNTER — Other Ambulatory Visit: Payer: Self-pay

## 2021-12-09 ENCOUNTER — Telehealth: Payer: Self-pay | Admitting: Licensed Clinical Social Worker

## 2021-12-09 ENCOUNTER — Ambulatory Visit: Payer: Medicare Other | Attending: General Surgery | Admitting: Rehabilitation

## 2021-12-09 DIAGNOSIS — Z483 Aftercare following surgery for neoplasm: Secondary | ICD-10-CM | POA: Diagnosis not present

## 2021-12-09 DIAGNOSIS — M25612 Stiffness of left shoulder, not elsewhere classified: Secondary | ICD-10-CM | POA: Diagnosis present

## 2021-12-09 NOTE — Telephone Encounter (Signed)
[place] ?Clinical Social Work ? ?Clinical Social Work was referred by  physical therapist  for concern for depression.  Clinical Social Worker  attempted to contact pt by phone   to offer support and assess for needs.   ?No answer. Left VM with direct contact information. ? ? ? ? ?Purnell Daigle E Prayan Ulin, LCSW  ?Clinical Social Worker ?[place] ?      ? ?

## 2021-12-09 NOTE — Telephone Encounter (Signed)
[facility] Clinical Social Work ? ?CSW received call back from pt. Pt describes feeling tired all of the time and that she is waiting on results from tests that her PCP ran to determine if there is a physical cause for her symptoms. Pt denied feeling down, depressed, or hopeless. She did state that it is difficult not being able to get out since she does not currently have a vehicle and she feels cut off. CSW discussed options for increasing social connection [facility] [employer]) as well as counseling option for coping with frustration around physical symptoms. Mailed information today per her request. ? ? ?Bianca Fletcher E Caisley Baxendale, LCSW ?

## 2021-12-09 NOTE — Patient Instructions (Signed)
Access Code: [email or web address]: [email or web address]: 03/09/2023Prepared by: Marcene Brawn TevisExercises  ?Seated Shoulder Flexion AAROM with Pulley Behind - 1 x daily - 7 x weekly - 1 sets - 5 secon hold  ?Seated Shoulder Abduction AAROM with Pulley Behind - 1 x daily - 7 x weekly - 1 sets - 5 second hold  ?Supine Shoulder Flexion AAROM with Hands Clasped - 1 x daily - 7 x weekly - 1 sets - 10 reps - 5 second hold  ?Standing Shoulder Flexion Wall Walk - 1 x daily - 7 x weekly - 1 sets - 10 reps - 5 secon hold  ?Doorway Pec Stretch at 120 Degrees Abduction - 1 x daily - 7 x weekly - 1 sets - 3 reps - 30 hold  ?Standing Row with Anchored Resistance - 1 x daily - 3-4 x weekly - 1-3 sets - 10 reps - 2-3 second hold ?

## 2021-12-09 NOTE — Therapy (Signed)
[facility] ?[facility] @ [place] ?[place], [place], [address] ?Phone: [phone]   Fax:  [phone] ? ?Physical Therapy Treatment ? ?Patient Details  ?Name: Bianca Fletcher ?MRN: [id number] ?Date of Birth: 07-Jan-1966 ?Referring Provider (PT): Dr. Barry Dienes ? ? ?Encounter Date: 12/09/2021 ? ? PT End of Session - 12/09/21 1156   ? ? Visit Number 2   ? Number of Visits 4   ? Date for PT Re-Evaluation 12/23/21   ? PT Start Time 1100   ? PT Stop Time 1062   ? PT Time Calculation (min) 42 min   ? Activity Tolerance Patient tolerated treatment well   ? Behavior During Therapy [facility] for tasks assessed/performed   ? ?  ?  ? ?  ? ? ?Past Medical History:  ?Diagnosis Date  ? Breast cancer ([place]) 2003  ? right breast  ? Cancer [facility]) 2003  ? right breast-no other tx.  ? Depression   ? GERD (gastroesophageal reflux disease)   ? High cholesterol   ? Migraine   ? ? ?Past Surgical History:  ?Procedure Laterality Date  ? BREAST BIOPSY Left 07/16/2021  ? stereo bx,x-clip, path pending  ? BREAST BIOPSY Left 07/29/2021  ? stereo bx, ribbon clip, path pending  ? BREAST SURGERY    ? MASTECTOMY Right 2003  ? positive  ? MASTECTOMY W/ SENTINEL NODE BIOPSY Left 09/09/2021  ? Procedure: LEFT MASTECTOMY WITH SENTINEL LYMPH NODE BIOPSY;  Surgeon: Stark Klein, MD;  Location: [facility];  Service: General;  Laterality: Left;  ? TONSILLECTOMY    ? ? ?There were no vitals filed for this visit. ? ? Subjective Assessment - 12/09/21 1057   ? ? Subjective I am feeling weak.  I think like more fatigued. The chest feels tight. My bra just comes unsnapped. I can do my hair now   ? Patient is accompained by: --   Power of attorney - Judson Roch stayed in lobby today  ? Pertinent History Lt Mastectomy 09/09/21 0/4 lymph nodes.  No radiation needed.  History of Rt Mastectomy 20 years ago. Other hx: intellectual disability, high cholesterol   ? Currently in Pain? No/denies   ? ?  ?  ? ?  ? ? ? ? ? [facility] PT  Assessment - 12/09/21 0001   ? ?  ? Observation/Other Assessments  ? Observations less edema noted inferior incision.   ?  ? AROM  ? Left Shoulder Extension 40 Degrees   ? Left Shoulder Flexion 160 Degrees   pull in axilla  ? Left Shoulder ABduction 160 Degrees   ? Left Shoulder External Rotation 80 Degrees   ? ?  ?  ? ?  ? ? ? ? ? ? ? ? ? ? ? ? ? ? ? ? [place] Adult PT Treatment/Exercise - 12/09/21 0001   ? ?  ? Self-Care  ? Self-Care Other Self-Care Comments   ? Other Self-Care Comments  Has a new soft front closure bra from [employer] but pt has problems with it coming un clasped.  Discussed bra options and showed pt [place] bra and gave handout.  Advised her to go back to [employer] and discuss options. Discuss cancer related fatigue, reasons for increased fatigue, and upon talking pt noted that she is feeling more alone and sad.  Discussed depression hx and how she is not able to afford therapy but already takes antidepressants.  Reached out to social work with pt  approval for resources.  Also discussed walking outside, other things that pt finds helpful, etc.   ?  ? Exercises  ? Exercises Other Exercises   ? Other Exercises  Added doorway stretch and standing yellow band row to HEP with new handout given.  performed doorway 2x30" and row x 10 all with vcs for initial completion.   ? ?  ?  ? ?  ? ? ? ? ? ? ? ? ? ? ? ? ? ? ? PT Long Term Goals - 12/09/21 1204   ? ?  ? PT LONG TERM GOAL #1  ? Title Pt will increase Lt shoulder flexion and abduction to at least 150 degrees   ? Status Achieved   ?  ? PT LONG TERM GOAL #2  ? Title Pt will be ind with final HEP   ? Status Achieved   ? ?  ?  ? ?  ? ? ? ? ? ? ? ? Plan - 12/09/21 1202   ? ? Clinical Impression Statement Pt has returned to AROM equal to the opposite side +/- 5 degrees with some anterior chest pull only.  Pt feels better doing her hair and feels ready to start mowing her yard this weekend.  See self care for discussion points for today but pt  will do well with HEP and was educated on returning for any signs of swelling in the arm or increased stiffness.  Discusssed POC with POA.   ? Consulted and Agree with Plan of Care Patient;Other (Comment)   ? ?  ?  ? ?  ? ? ?Patient will benefit from skilled therapeutic intervention in order to improve the following deficits and impairments:    ? ?Visit Diagnosis: ?Aftercare following surgery for neoplasm ? ?Stiffness of left shoulder, not elsewhere classified ? ? ? ? ?Problem List ?Patient Active Problem List  ? Diagnosis Date Noted  ? Intellectual disability 09/10/2021  ? Breast cancer, left ([place]) 09/09/2021  ? Ductal carcinoma in situ (DCIS) of left breast 08/05/2021  ? Depression, major, in remission ([place]) 02/09/2015  ? History of breast cancer 02/09/2015  ? Migraine without aura and without status migrainosus, not intractable 02/09/2015  ? Morbid obesity with body mass index of 40.0-44.9 in adult [facility]) 02/09/2015  ? Pure hypercholesterolemia 02/09/2015  ? ? ?Stark Bray, PT ?12/09/2021, 12:04 PM ? ?[facility] ?[facility] @ [place] ?[place], [place], [address] ?Phone: [phone]   Fax:  [phone] ? ?Name: Bianca Fletcher ?MRN: [id number] ?Date of Birth: 1965/11/05 ? ?PHYSICAL THERAPY DISCHARGE SUMMARY ? ?Visits from Start of Care: 2 ? ?Current functional level related to goals / functional outcomes: ?See above ?  ?Remaining deficits: ?Chest wall tightness ?  ?Education / Equipment: ?HEP  ?Plan: ?Patient agrees to discharge.  Patient goals were not met. Patient is being discharged due to meeting the stated rehab goals.    ? ? ? ? ?

## 2022-02-02 ENCOUNTER — Inpatient Hospital Stay: Payer: Medicare Other | Admitting: Adult Health

## 2022-02-02 ENCOUNTER — Other Ambulatory Visit: Payer: Self-pay | Admitting: Adult Health

## 2022-02-02 DIAGNOSIS — D0512 Intraductal carcinoma in situ of left breast: Secondary | ICD-10-CM

## 2022-02-02 NOTE — Progress Notes (Deleted)
SURVIVORSHIP VIRTUAL VISIT:  I connected with *** on 02/02/22 at 11:15 AM EDT by *** and verified that I am speaking with the correct person using two identifiers.  I discussed the limitations, risks, security and privacy concerns of performing an evaluation and management service by telephone and the availability of in person appointments. I also discussed with the patient that there may be a patient responsible charge related to this service. The patient expressed understanding and agreed to proceed.   BRIEF ONCOLOGIC HISTORY:  Oncology History  Ductal carcinoma in situ (DCIS) of left breast  07/16/2021 Initial Diagnosis   Screening mammogram detected left breast calcifications measuring 3.4 cm  Stereotactic biopsy of Left breast UOQ calcifications: Intermediate grade DCIS with calcifications and necrosis ER 90 to 100% strong staining  Additional left breast calcifications; stereotactic biopsy: Benign breast tissue with fibrocystic and sclerosing adenosis   08/10/2021 Cancer Staging   Staging form: Breast, AJCC 8th Edition - Clinical stage from 08/10/2021: Stage 0 (cTis (DCIS), cN0, cM0, ER+, PR: Not Assessed, HER2: Not Assessed) - Signed by Nicholas Lose, MD on 08/18/2021 Stage prefix: Initial diagnosis    09/09/2021 Surgery   Left mastectomy: No evidence of residual DCIS, 0/4 lymph nodes, ER 91 200%, PR not performed     INTERVAL HISTORY:  Ms. Yott to review her survivorship care plan detailing her treatment course for breast cancer, as well as monitoring long-term side effects of that treatment, education regarding health maintenance, screening, and overall wellness and health promotion.     Overall, Ms. Langlinais reports feeling quite well   REVIEW OF SYSTEMS:  Review of Systems - Oncology Breast: Denies any new nodularity, masses, tenderness, nipple changes, or nipple discharge.      ONCOLOGY TREATMENT TEAM:  1. Surgeon:  Dr. Marland Kitchen at [facility] 2. Medical  Oncologist: Dr. Marland Kitchen  3. Radiation Oncologist: Dr. Marland Kitchen    PAST MEDICAL/SURGICAL HISTORY:  Past Medical History:  Diagnosis Date   Breast cancer [facility]) 2003   right breast   Cancer ([place]) 2003   right breast-no other tx.   Depression    GERD (gastroesophageal reflux disease)    High cholesterol    Migraine    Past Surgical History:  Procedure Laterality Date   BREAST BIOPSY Left 07/16/2021   stereo bx,x-clip, path pending   BREAST BIOPSY Left 07/29/2021   stereo bx, ribbon clip, path pending   BREAST SURGERY     MASTECTOMY Right 2003   positive   MASTECTOMY W/ SENTINEL NODE BIOPSY Left 09/09/2021   Procedure: LEFT MASTECTOMY WITH SENTINEL LYMPH NODE BIOPSY;  Surgeon: Stark Klein, MD;  Location: [facility];  Service: General;  Laterality: Left;   TONSILLECTOMY       ALLERGIES:  Allergies  Allergen Reactions   Amoxicillin Other (See Comments)   Penicillins Rash     CURRENT MEDICATIONS:  Outpatient Encounter Medications as of 02/02/2022  Medication Sig Note   buPROPion (WELLBUTRIN XL) 150 MG 24 hr tablet Take 300 mg by mouth daily.    clotrimazole (LOTRIMIN) 1 % cream Apply to affected area 2 times daily (Patient taking differently: Apply 1 application. topically daily as needed (infection).)    escitalopram (LEXAPRO) 10 MG tablet Take 10 mg by mouth daily.    gabapentin (NEURONTIN) 100 MG capsule Take 1 capsule (100 mg total) by mouth 2 (two) times daily.    methocarbamol (ROBAXIN) 500 MG tablet Take 1 tablet (500 mg total) by mouth every 6 (six) hours as  needed for muscle spasms.    oxyCODONE (OXY IR/ROXICODONE) 5 MG immediate release tablet Take 1-2 tablets (5-10 mg total) by mouth every 4 (four) hours as needed for moderate pain.    propranolol ER (INDERAL LA) 80 MG 24 hr capsule Take 80 mg by mouth daily.    rosuvastatin (CRESTOR) 20 MG tablet Take 20 mg by mouth daily.    SUMAtriptan (IMITREX) 100 MG tablet Take 100 mg by mouth daily as needed for  migraine.    topiramate (TOPAMAX) 100 MG tablet Take 100 mg by mouth daily. 05/29/2021: Pharmacy record says 2 times daily   Facility-Administered Encounter Medications as of 02/02/2022  Medication   0.9 %  sodium chloride infusion     ONCOLOGIC FAMILY HISTORY:  Family History  Problem Relation Age of Onset   Alzheimer's disease Mother    Kidney disease Father    Ulcers Father    Gout Father    Breast cancer Paternal Grandmother    Colon cancer Neg Hx    Esophageal cancer Neg Hx    Rectal cancer Neg Hx    Stomach cancer Neg Hx      GENETIC COUNSELING/TESTING: ***  SOCIAL HISTORY:  Social History   Socioeconomic History   Marital status: Single    Spouse name: Not on file   Number of children: Not on file   Years of education: Not on file   Highest education level: Not on file  Occupational History   Not on file  Tobacco Use   Smoking status: Former   Smokeless tobacco: Never  Vaping Use   Vaping Use: Never used  Substance and Sexual Activity   Alcohol use: No    Alcohol/week: 0.0 standard drinks   Drug use: No   Sexual activity: Not Currently    Birth control/protection: None  Other Topics Concern   Not on file  Social History Narrative   Not on file   Social Determinants of Health   Financial Resource Strain: Not on file  Food Insecurity: Not on file  Transportation Needs: Not on file  Physical Activity: Not on file  Stress: Not on file  Social Connections: Not on file  Intimate Partner Violence: Not on file     OBSERVATIONS/OBJECTIVE:   LABORATORY DATA:  None for this visit.  DIAGNOSTIC IMAGING:  None for this visit.      ASSESSMENT AND PLAN:  Ms.. Justen is a pleasant 56 y.o. female with Stage *** right/left breast invasive ductal carcinoma, ER+/PR+/HER2-, diagnosed in ***, treated with lumpectomy, adjuvant radiation therapy, and anti-estrogen therapy with *** beginning in ***.  She presents to the Survivorship Clinic for our initial  meeting and routine follow-up post-completion of treatment for breast cancer.    1. Stage *** right/left breast cancer:  Ms. Hibbitts is continuing to recover from definitive treatment for breast cancer. She will follow-up with her medical oncologist, Dr. Ross Ludwig in *** with history and physical exam per surveillance protocol.  She will continue her anti-estrogen therapy with ***. Thus far, she is tolerating the *** well, with minimal side effects. She was instructed to make Dr. Lindi Adie or myself aware if she begins to experience any worsening side effects of the medication and I could see her back in clinic to help manage those side effects, as needed. Her mammogram is due ***; orders placed today.  Her breast density is category ***. Today, a comprehensive survivorship care plan and treatment summary was reviewed with the patient today detailing her breast  cancer diagnosis, treatment course, potential late/long-term effects of treatment, appropriate follow-up care with recommendations for the future, and patient education resources.  A copy of this summary, along with a letter will be sent to the patient's primary care provider via mail/fax/In Basket message after today's visit.    #. Problem(s) at Visit______________  #. Bone health:  Given Ms. Murcia's age/history of breast cancer and her current treatment regimen including anti-estrogen therapy with ***, she is at risk for bone demineralization.  Her last DEXA scan was ***, which showed ***.  In the meantime, she was encouraged to increase her consumption of foods rich in calcium, as well as increase her weight-bearing activities.  She was given education on specific activities to promote bone health.  #. Cancer screening:  Due to Ms. Bruns's history and her age, she should receive screening for skin cancers, colon cancer, and gynecologic cancers.  The information and recommendations are listed on the patient's comprehensive care  plan/treatment summary and were reviewed in detail with the patient.    #. Health maintenance and wellness promotion: Ms. Aber was encouraged to consume 5-7 servings of fruits and vegetables per day. We reviewed the "Nutrition Rainbow" handout, as well as the handout "Take Control of Your Health and Reduce Your Cancer Risk" from the [employer].  She was also encouraged to engage in moderate to vigorous exercise for 30 minutes per day most days of the week. We discussed the [employer] fitness program, which is designed for cancer survivors to help them become more physically fit after cancer treatments.  She was instructed to limit her alcohol consumption and continue to abstain from tobacco use/***was encouraged stop smoking.     #. Support services/counseling: It is not uncommon for this period of the patient's cancer care trajectory to be one of many emotions and stressors.  We discussed how this can be increasingly difficult during the times of quarantine and social distancing due to the COVID-19 pandemic.   She was given information regarding our available services and encouraged to contact me with any questions or for help enrolling in any of our support group/programs.    Follow up instructions:    -Return to cancer center ***  -Mammogram due in *** -Follow up with surgery *** -She is welcome to return back to the Survivorship Clinic at any time; no additional follow-up needed at this time.  -Consider referral back to survivorship as a long-term survivor for continued surveillance  The patient was provided an opportunity to ask questions and all were answered. The patient agreed with the plan and demonstrated an understanding of the instructions.   The patient was advised to call back or seek an in-person evaluation if the symptoms worsen or if the condition fails to improve as anticipated.   I provided *** minutes of {Blank single:19197::"face-to-face video visit  time","non face-to-face telephone visit time"} during this encounter, and > 50% was spent counseling as documented under my assessment & plan.  Wilber Bihari, NP 02/02/22 11:24 AM Medical Oncology and Hematology [facility] [place], [place] [address] Tel. [phone]    Fax. [phone]

## 2022-02-02 NOTE — Progress Notes (Signed)
Surv referral order ?

## 2022-02-03 ENCOUNTER — Inpatient Hospital Stay: Payer: Medicare Other | Attending: Adult Health | Admitting: Adult Health

## 2022-02-03 DIAGNOSIS — D0512 Intraductal carcinoma in situ of left breast: Secondary | ICD-10-CM

## 2022-02-03 NOTE — Progress Notes (Signed)
Patient did not show.

## 2022-02-11 ENCOUNTER — Other Ambulatory Visit: Payer: Self-pay | Admitting: Nurse Practitioner

## 2022-02-11 ENCOUNTER — Other Ambulatory Visit (HOSPITAL_COMMUNITY)
Admission: RE | Admit: 2022-02-11 | Discharge: 2022-02-11 | Disposition: A | Discharge status: Home / Self Care | Payer: Medicare Other | Source: Ambulatory Visit | Attending: Nurse Practitioner | Admitting: Nurse Practitioner

## 2022-02-11 DIAGNOSIS — Z124 Encounter for screening for malignant neoplasm of cervix: Secondary | ICD-10-CM | POA: Diagnosis present

## 2022-02-11 DIAGNOSIS — Z1151 Encounter for screening for human papillomavirus (HPV): Secondary | ICD-10-CM | POA: Diagnosis not present

## 2022-02-15 LAB — CYTOLOGY - PAP
Comment: NEGATIVE
Diagnosis: NEGATIVE
High risk HPV: NEGATIVE

## 2022-04-18 ENCOUNTER — Other Ambulatory Visit: Payer: Self-pay

## 2022-05-02 DIAGNOSIS — J329 Chronic sinusitis, unspecified: Secondary | ICD-10-CM | POA: Insufficient documentation

## 2022-05-02 DIAGNOSIS — R3989 Other symptoms and signs involving the genitourinary system: Secondary | ICD-10-CM | POA: Insufficient documentation

## 2022-05-02 DIAGNOSIS — Z860100 Personal history of colon polyps, unspecified: Secondary | ICD-10-CM | POA: Insufficient documentation

## 2022-05-02 DIAGNOSIS — R203 Hyperesthesia: Secondary | ICD-10-CM | POA: Insufficient documentation

## 2022-05-02 DIAGNOSIS — K921 Melena: Secondary | ICD-10-CM | POA: Insufficient documentation

## 2022-05-02 DIAGNOSIS — N939 Abnormal uterine and vaginal bleeding, unspecified: Secondary | ICD-10-CM | POA: Insufficient documentation

## 2022-08-11 ENCOUNTER — Ambulatory Visit: Payer: Medicare Other | Admitting: Hematology and Oncology

## 2022-09-03 NOTE — Progress Notes (Signed)
Patient Care Team: Tisovec, Fransico Him, MD as PCP - General (Internal Medicine) Nicholas Lose, MD as Consulting Physician (Hematology and Oncology) Stark Klein, MD as Consulting Physician (General Surgery)  DIAGNOSIS: No diagnosis found.  SUMMARY OF ONCOLOGIC HISTORY: Oncology History  Ductal carcinoma in situ (DCIS) of left breast  07/16/2021 Initial Diagnosis   Screening mammogram detected left breast calcifications measuring 3.4 cm  Stereotactic biopsy of Left breast UOQ calcifications: Intermediate grade DCIS with calcifications and necrosis ER 90 to 100% strong staining  Additional left breast calcifications; stereotactic biopsy: Benign breast tissue with fibrocystic and sclerosing adenosis   08/10/2021 Cancer Staging   Staging form: Breast, AJCC 8th Edition - Clinical stage from 08/10/2021: Stage 0 (cTis (DCIS), cN0, cM0, ER+, PR: Not Assessed, HER2: Not Assessed) - Signed by Nicholas Lose, MD on 08/18/2021 Stage prefix: Initial diagnosis   09/09/2021 Surgery   Left mastectomy: No evidence of residual DCIS, 0/4 lymph nodes, ER 91 200%, PR not performed     CHIEF COMPLIANT:   INTERVAL HISTORY: Bianca Fletcher is a   ALLERGIES:  is allergic to amoxicillin and penicillins.  MEDICATIONS:  Current Outpatient Medications  Medication Sig Dispense Refill   buPROPion (WELLBUTRIN XL) 150 MG 24 hr tablet Take 300 mg by mouth daily.     clotrimazole (LOTRIMIN) 1 % cream Apply to affected area 2 times daily (Patient taking differently: Apply 1 application. topically daily as needed (infection).) 15 g 0   escitalopram (LEXAPRO) 10 MG tablet Take 10 mg by mouth daily.     gabapentin (NEURONTIN) 100 MG capsule Take 1 capsule (100 mg total) by mouth 2 (two) times daily. 30 capsule 0   methocarbamol (ROBAXIN) 500 MG tablet Take 1 tablet (500 mg total) by mouth every 6 (six) hours as needed for muscle spasms. 20 tablet 0   oxyCODONE (OXY IR/ROXICODONE) 5 MG immediate release tablet Take  1-2 tablets (5-10 mg total) by mouth every 4 (four) hours as needed for moderate pain. 20 tablet 0   propranolol ER (INDERAL LA) 80 MG 24 hr capsule Take 80 mg by mouth daily.     rosuvastatin (CRESTOR) 20 MG tablet Take 20 mg by mouth daily.     SUMAtriptan (IMITREX) 100 MG tablet Take 100 mg by mouth daily as needed for migraine.     topiramate (TOPAMAX) 100 MG tablet Take 100 mg by mouth daily.     Current Facility-Administered Medications  Medication Dose Route Frequency Provider Last Rate Last Admin   0.9 %  sodium chloride infusion  500 mL Intravenous Once Armbruster, Carlota Raspberry, MD        PHYSICAL EXAMINATION: ECOG PERFORMANCE STATUS: {CHL ONC ECOG NG:[id number]}  There were no vitals filed for this visit. There were no vitals filed for this visit.  BREAST:*** No palpable masses or nodules in either right or left breasts. No palpable axillary supraclavicular or infraclavicular adenopathy no breast tenderness or nipple discharge. (exam performed in the presence of a chaperone)  LABORATORY DATA:  I have reviewed the data as listed    Latest Ref Rng & Units 05/29/2021    5:55 PM  CMP  Glucose 70 - 99 mg/dL 100   BUN 6 - 20 mg/dL 15   Creatinine 0.44 - 1.00 mg/dL 0.84   Sodium 135 - 145 mmol/L 139   Potassium 3.5 - 5.1 mmol/L 3.9   Chloride 98 - 111 mmol/L 108   CO2 22 - 32 mmol/L 25   Calcium 8.9 - 10.3  mg/dL 9.0   Total Protein 6.5 - 8.1 g/dL 7.1   Total Bilirubin 0.3 - 1.2 mg/dL 0.6   Alkaline Phos 38 - 126 U/L 95   AST 15 - 41 U/L 23   ALT 0 - 44 U/L 25     Lab Results  Component Value Date   WBC 9.0 05/29/2021   HGB 13.0 05/29/2021   HCT 41.2 05/29/2021   MCV 88.6 05/29/2021   PLT 279 05/29/2021    ASSESSMENT & PLAN:  No problem-specific Assessment & Plan notes found for this encounter.    No orders of the defined types were placed in this encounter.  The patient has a good understanding of the overall plan. she agrees with it. she will call with any  problems that may develop before the next visit here. Total time spent: 30 mins including face to face time and time spent for planning, charting and co-ordination of care   Suzzette Righter, [place] 09/03/22    I Gardiner Coins am scribing for Dr. Lindi Adie  ***

## 2022-09-07 ENCOUNTER — Telehealth: Payer: Self-pay | Admitting: Physical Therapy

## 2022-09-07 ENCOUNTER — Other Ambulatory Visit: Payer: Self-pay

## 2022-09-07 ENCOUNTER — Inpatient Hospital Stay: Payer: Medicare Other | Attending: Hematology and Oncology | Admitting: Hematology and Oncology

## 2022-09-07 VITALS — BP 109/63 | HR 67 | Temp 97.7°F | Resp 18 | Ht 63.0 in | Wt 251.1 lb

## 2022-09-07 DIAGNOSIS — D0512 Intraductal carcinoma in situ of left breast: Secondary | ICD-10-CM | POA: Insufficient documentation

## 2022-09-07 DIAGNOSIS — Z9013 Acquired absence of bilateral breasts and nipples: Secondary | ICD-10-CM | POA: Diagnosis not present

## 2022-09-07 NOTE — Telephone Encounter (Signed)
Saw Bianca Fletcher per Dr. Geralyn Flash request while she was at the cancer center for a f/u appt with him. She had concerns about her left arm being swollen and tender. Measured bilateral arms and compared to previously taken measurements in 10/2021. There was no significant difference. Performed the SOZO screen where she scored 2.8 which was less than at baseline so there is no indication this is lymphedema. She reported upper arm tenderness but there was no sign of pain with palpation, redness or visible edema. It was also documented from her last PT assessment that "My left arm has always been bigger than my right." So it does not appear to be a concern of infection or DVT based on exam. Encouraged her and her guardian to consider returning to their orthopedist where she was previously treated for shoulder bursitis. They were in agreement and reported good understanding. Annia Friendly, [place] 09/07/22 4:02 PM

## 2022-09-07 NOTE — Assessment & Plan Note (Signed)
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 Return to clinic on an as-needed basis.

## 2022-11-11 DIAGNOSIS — I890 Lymphedema, not elsewhere classified: Secondary | ICD-10-CM | POA: Insufficient documentation

## 2022-11-24 ENCOUNTER — Encounter: Payer: Self-pay | Admitting: Physical Therapy

## 2022-11-24 ENCOUNTER — Other Ambulatory Visit: Payer: Self-pay

## 2022-11-24 ENCOUNTER — Ambulatory Visit: Payer: 59 | Attending: General Surgery | Admitting: Physical Therapy

## 2022-11-24 DIAGNOSIS — Z9012 Acquired absence of left breast and nipple: Secondary | ICD-10-CM | POA: Diagnosis present

## 2022-11-24 DIAGNOSIS — I972 Postmastectomy lymphedema syndrome: Secondary | ICD-10-CM | POA: Diagnosis present

## 2022-11-24 NOTE — Therapy (Signed)
OUTPATIENT PHYSICAL THERAPY ONCOLOGY EVALUATION  Patient Name: Bianca Fletcher MRN: [id number] DOB:1965-12-30, 57 y.o., female Today's Date: 11/24/2022  END OF SESSION:  PT End of Session - 11/24/22 1318     Visit Number 1    Number of Visits 13    Date for PT Re-Evaluation 12/22/22    PT Start Time 1215   arrived late   PT Stop Time 1300    PT Time Calculation (min) 45 min    Activity Tolerance Patient tolerated treatment well    Behavior During Therapy [facility] for tasks assessed/performed             Past Medical History:  Diagnosis Date   Breast cancer ([place]) 2003   right breast   Cancer ([place]) 2003   right breast-no other tx.   Depression    GERD (gastroesophageal reflux disease)    High cholesterol    Migraine    Past Surgical History:  Procedure Laterality Date   BREAST BIOPSY Left 07/16/2021   stereo bx,x-clip, path pending   BREAST BIOPSY Left 07/29/2021   stereo bx, ribbon clip, path pending   BREAST SURGERY     MASTECTOMY Right 2003   positive   MASTECTOMY W/ SENTINEL NODE BIOPSY Left 09/09/2021   Procedure: LEFT MASTECTOMY WITH SENTINEL LYMPH NODE BIOPSY;  Surgeon: Stark Klein, MD;  Location: [place];  Service: General;  Laterality: Left;   TONSILLECTOMY     Patient Active Problem List   Diagnosis Date Noted   Intellectual disability 09/10/2021   Ductal carcinoma in situ (DCIS) of left breast 08/05/2021   Depression, major, in remission ([place]) 02/09/2015   History of breast cancer 02/09/2015   Migraine without aura and without status migrainosus, not intractable 02/09/2015   Morbid obesity with body mass index of 40.0-44.9 in adult ([place]) 02/09/2015   Pure hypercholesterolemia 02/09/2015    PCP: Domenick Gong, MD  REFERRING PROVIDER: Stark Klein, MD   REFERRING DIAG: [phone] (ICD-10-CM) - Acquired absence of left breast and nipple   THERAPY DIAG:  Postmastectomy lymphedema  Acquired absence of breast and absent nipple,  left  ONSET DATE: 09/09/21  Rationale for Evaluation and Treatment: Rehabilitation  SUBJECTIVE:                                                                                                                                                                                           SUBJECTIVE STATEMENT: I feel like I am having swelling. I have been having trouble raising it up and down. It was sore on both sides but it could have been from the bed I was sleeping  on. That pain is better.  PERTINENT HISTORY:  Lt Mastectomy 09/09/21 0/4 lymph nodes. No radiation needed. History of Rt Mastectomy 20 years ago. Other hx: intellectual disability, high cholesterol , hx of L shoulder bursitis PAIN:  Are you having pain? No  PRECAUTIONS: Other: at risk of lymphedema  WEIGHT BEARING RESTRICTIONS: No  FALLS:  Has patient fallen in last 6 months? No  LIVING ENVIRONMENT: Lives with: lives alone Lives in: House/apartment Stairs: No;  Has following equipment at home: None  OCCUPATION: disabled IDD  LEISURE: none reported  HAND DOMINANCE: right   PRIOR LEVEL OF FUNCTION: Independent guardians provide transport  PATIENT GOALS: decrease pain   OBJECTIVE:  COGNITION: Overall cognitive status: History of cognitive impairments - at baseline Pt has an appointed guardian  OBSERVATIONS / OTHER ASSESSMENTS: fullness very visible in L upper arm   POSTURE: forward head and rounded shoulders  UPPER EXTREMITY AROM/PROM:  A/PROM RIGHT   eval   Shoulder extension   Shoulder flexion 152  Shoulder abduction 147  Shoulder internal rotation   Shoulder external rotation     (Blank rows = not tested)  A/PROM LEFT   eval  Shoulder extension   Shoulder flexion 149  Shoulder abduction 173  Shoulder internal rotation   Shoulder external rotation     (Blank rows = not tested)  LYMPHEDEMA ASSESSMENTS:   SURGERY TYPE/DATE: L mastectomy in 2022, R mastectomy 20 years ago  NUMBER OF LYMPH  NODES REMOVED: 4 on L side all negative  CHEMOTHERAPY: none   RADIATION:none   LYMPHEDEMA ASSESSMENTS:   LANDMARK RIGHT  eval  10 cm proximal to olecranon process 37.7  Olecranon process 29.1  10 cm proximal to ulnar styloid process 25.6  Just proximal to ulnar styloid process 19  Across hand at thumb web space 20.4  At base of 2nd digit 6.5  (Blank rows = not tested)  LANDMARK LEFT  eval  10 cm proximal to olecranon process 44.6  Olecranon process 30.5  10 cm proximal to ulnar styloid process 25.3  Just proximal to ulnar styloid process 19.6  Across hand at thumb web space 21  At base of 2nd digit 6.6  (Blank rows = not tested)   L-DEX LYMPHEDEMA SCREENING:  The patient was assessed using the L-Dex machine today to produce a lymphedema index baseline score. The patient will be reassessed on a regular basis (typically every 3 months) to obtain new L-Dex scores. If the score is > 6.5 points away from his/her baseline score indicating onset of subclinical lymphedema, it will be recommended to wear a compression garment for 4 weeks, 12 hours per day and then be reassessed. If the score continues to be > 6.5 points from baseline at reassessment, we will initiate lymphedema treatment. Assessing in this manner has a 95% rate of preventing clinically significant lymphedema.   L-DEX FLOWSHEETS - 11/24/22 1200       L-DEX LYMPHEDEMA SCREENING   Measurement Type Unilateral    L-DEX MEASUREMENT EXTREMITY Upper Extremity    POSITION  Standing    DOMINANT SIDE Right    At Risk Side Left    BASELINE SCORE (UNILATERAL) 4.7    L-DEX SCORE (UNILATERAL) 1.2    VALUE CHANGE (UNILAT) -3.5             L-DEX FLOWSHEETS - 11/24/22 1200       L-DEX LYMPHEDEMA SCREENING   Measurement Type Unilateral    L-DEX MEASUREMENT EXTREMITY Upper Extremity    POSITION  Standing    DOMINANT SIDE Right    At Risk Side Left    BASELINE SCORE (UNILATERAL) 4.7    L-DEX SCORE (UNILATERAL) 1.2     VALUE CHANGE (UNILAT) -3.5            Despite this there a is large measurable difference between both UEs and SOZO has not shown reliability once there is visible swelling.    TODAY'S TREATMENT:                                                                                                                                         DATE: None today secondary to time contraints  PATIENT EDUCATION:  Education details: lymphedema, signs and symptoms, not to use L side for blood pressure and needle sticks Person educated: Patient and guardian Education method: Explanation Education comprehension: verbalized understanding  HOME EXERCISE PROGRAM: Continue with stretches  ASSESSMENT:  CLINICAL IMPRESSION: Patient is a 57 y.o. female who was seen today for physical therapy evaluation and treatment for L shoulder pain and swelling. Pt reports to PT with shoulder pain since December 2023 and and she was unable to verbalize or localize the pain. Her guardian was contacted and she was able to give more history. The guardian she had today is not usually with her. Pt's SOZO score was in the green but there was a 7 cm difference at 10 cm proximal to olecranon with the L side being larger. This could be lymphedema but since the swelling is all proximal which is unusual (normally lymphedema presents distally) a message was sent to her doctor. She has not had any recent imaging. Will place pt on hold for now until cleared by doctor and then once she receives clearance will proceed with lymphedema treatment including MLD and compression bandaging followed by compression garments.    OBJECTIVE IMPAIRMENTS: decreased knowledge of condition, decreased knowledge of use of DME, increased edema, increased fascial restrictions, impaired UE functional use, postural dysfunction, and pain.   ACTIVITY LIMITATIONS: reach over head  PARTICIPATION LIMITATIONS:  none  PERSONAL FACTORS: Time since onset of  injury/illness/exacerbation are also affecting patient's functional outcome.   REHAB POTENTIAL: Good  CLINICAL DECISION MAKING: Stable/uncomplicated  EVALUATION COMPLEXITY: Low  GOALS: Goals reviewed with patient? Yes  SHORT TERM GOALS=LONG TERM GOALS Target date: 12/22/22  Pt will demonstrate a 4 cm decrease at edema at 10 cm proximal to olecranon to decrease risk of infection.  Baseline: Goal status: INITIAL  2.  Pt will obtain appropriate compression garments for long term management of lymphedema.  Baseline:  Goal status: INITIAL  3.  Pt will be independent in self MLD if able for long term management of lymphedema.  Baseline:  Goal status: INITIAL  PLAN:  PT FREQUENCY: 3x/week  PT DURATION: 4 weeks  PLANNED INTERVENTIONS: Therapeutic exercises, Therapeutic activity, Patient/Family education, Self Care, Joint mobilization, Orthotic/Fit training, Manual lymph  drainage, Compression bandaging, Vasopneumatic device, Manual therapy, and Re-evaluation  PLAN FOR NEXT SESSION: once cleared can begin CDT, 3x/wk for 4 wks Best contact is Judson Roch [phone]   Manus Gunning, PT 11/24/2022, 1:20 PM

## 2022-11-25 ENCOUNTER — Other Ambulatory Visit: Payer: Self-pay

## 2022-11-25 ENCOUNTER — Other Ambulatory Visit: Payer: Self-pay | Admitting: *Deleted

## 2022-11-25 DIAGNOSIS — M7989 Other specified soft tissue disorders: Secondary | ICD-10-CM

## 2022-11-25 NOTE — Progress Notes (Signed)
Verbal orders received by Bianca Bihari NP to obtain Vas [place] left arm to r/o DVT.  Orders placed, appt scheduled.  Pt notified and verbalized understanding.

## 2022-11-25 NOTE — Progress Notes (Signed)
Verbal orders received by Wilber Bihari NP for shoulder x-ray. Patient notified and verbalized understanding.

## 2022-11-28 ENCOUNTER — Telehealth: Payer: Self-pay

## 2022-11-28 NOTE — Telephone Encounter (Signed)
Called Bianca Fletcher, patients legal guardian to make sure the patient has a ride to her appointments on 11/29/22. This LPN made her aware to come in for an x-ray first at 1:30pm, then doppler is at 2:00pm, and then she will see Wilber Bihari NP at 2:30pm. She verbalized understanding.

## 2022-11-29 ENCOUNTER — Ambulatory Visit (HOSPITAL_COMMUNITY): Payer: 59

## 2022-11-29 ENCOUNTER — Inpatient Hospital Stay: Payer: Self-pay | Admitting: Adult Health

## 2023-03-15 ENCOUNTER — Ambulatory Visit (AMBULATORY_SURGERY_CENTER): Payer: 59

## 2023-03-15 ENCOUNTER — Other Ambulatory Visit: Payer: Self-pay

## 2023-03-15 VITALS — Ht 63.0 in | Wt 251.0 lb

## 2023-03-15 DIAGNOSIS — Z86010 Personal history of colonic polyps: Secondary | ICD-10-CM

## 2023-03-15 MED ORDER — NA SULFATE-K SULFATE-MG SULF 17.5-3.13-1.6 GM/177ML PO SOLN: 1.0000 | Freq: Once | ORAL | 0 refills | Status: AC

## 2023-03-15 NOTE — Progress Notes (Signed)
Denies allergies to eggs or soy products. Denies complication of anesthesia or sedation. Denies use of weight loss medication. Denies use of O2.   Emmi instructions given for colonoscopy. Pre-Visit was conducted by phone with Jream and Roxy Horseman who is Sorrel's POA. Maralyn Sago will be with Sheccid to help her with her prep.

## 2023-03-16 ENCOUNTER — Telehealth: Payer: Self-pay | Admitting: Gastroenterology

## 2023-03-20 NOTE — Telephone Encounter (Signed)
Called patient back with no answer.  Left a message letting her know that we were trying to reach her regarding the prep and instructions, asked her to call back.

## 2023-03-20 NOTE — Telephone Encounter (Signed)
PT is calling to have prep meds sent to pharmacy. She was also told that she would receive a coupon. She needs to have instructions gone over again with her.

## 2023-03-20 NOTE — Telephone Encounter (Signed)
Attempted to reach patient concerning prep medication; Unable to speak with patient; Left message that patient will need to contact her pharmacy as she must not have picked up the prep within 7-10 days of Pre Visit appt and they have probably placed the medication back on the shelf;  patient was also left information that she can request that the pharmacy use a GoodRx coupon for this prep;  Will attempt to reach patient at a later time/date;

## 2023-03-23 ENCOUNTER — Ambulatory Visit (AMBULATORY_SURGERY_CENTER): Payer: 59 | Admitting: Gastroenterology

## 2023-03-23 ENCOUNTER — Encounter: Payer: Self-pay | Admitting: Gastroenterology

## 2023-03-23 VITALS — BP 133/61 | HR 63 | Temp 98.6°F | Resp 12 | Ht 63.0 in | Wt 251.0 lb

## 2023-03-23 DIAGNOSIS — Z09 Encounter for follow-up examination after completed treatment for conditions other than malignant neoplasm: Secondary | ICD-10-CM

## 2023-03-23 DIAGNOSIS — Z86010 Personal history of colonic polyps: Secondary | ICD-10-CM

## 2023-03-23 MED ORDER — SODIUM CHLORIDE 0.9 % IV SOLN: 500.0000 mL | Freq: Once | INTRAVENOUS | Status: DC

## 2023-03-23 NOTE — Progress Notes (Signed)
Pt's states no medical or surgical changes since previsit or office visit. 

## 2023-03-23 NOTE — Progress Notes (Signed)
Uneventful anesthetic. Report to pacu rn. Vss. Care resumed by rn. 

## 2023-03-23 NOTE — Progress Notes (Signed)
[facility] History and Physical   Primary Care Physician:  Tisovec, Adelfa Koh, MD   Reason for Procedure:   History of colon polyps  Plan:    colonoscopy     HPI: Bianca Fletcher is a 57 y.o. female  here for colonoscopy surveillance - history of 6 polyps, largest 1cm in size, in 04/2018.    Patient denies any bowel symptoms at this time. She has had some "Stomach aches" for the past 4 days but not chronically. Otherwise feels well without any cardiopulmonary symptoms.   I have discussed risks / benefits of anesthesia and endoscopic procedure with Luci Bank and they wish to proceed with the exams as outlined today.    Past Medical History:  Diagnosis Date   Breast cancer [facility]) 2003   right breast   Cancer ([facility]) 2003   right breast-no other tx.   Depression    GERD (gastroesophageal reflux disease)    High cholesterol    Migraine     Past Surgical History:  Procedure Laterality Date   BREAST BIOPSY Left 07/16/2021   stereo bx,x-clip, path pending   BREAST BIOPSY Left 07/29/2021   stereo bx, ribbon clip, path pending   BREAST SURGERY     MASTECTOMY Right 2003   positive   MASTECTOMY W/ SENTINEL NODE BIOPSY Left 09/09/2021   Procedure: LEFT MASTECTOMY WITH SENTINEL LYMPH NODE BIOPSY;  Surgeon: Almond Lint, MD;  Location: [facility];  Service: General;  Laterality: Left;   TONSILLECTOMY      Prior to Admission medications   Medication Sig Start Date End Date Taking? Authorizing Provider  buPROPion (WELLBUTRIN XL) 150 MG 24 hr tablet Take 300 mg by mouth daily.   Yes [provider]  escitalopram (LEXAPRO) 10 MG tablet Take 10 mg by mouth daily. 03/09/16  Yes [provider]  gabapentin (NEURONTIN) 100 MG capsule Take 1 capsule (100 mg total) by mouth 2 (two) times daily. 09/09/21  Yes Almond Lint, MD  propranolol ER (INDERAL LA) 80 MG 24 hr capsule Take 80 mg by mouth daily. 05/21/21  Yes [provider]   rosuvastatin (CRESTOR) 20 MG tablet Take 20 mg by mouth daily. 04/15/21  Yes [provider]  SUMAtriptan (IMITREX) 100 MG tablet Take 100 mg by mouth daily as needed for migraine. 02/20/21  Yes [provider]  topiramate (TOPAMAX) 100 MG tablet Take 100 mg by mouth daily. 05/17/21  Yes [provider]  clotrimazole (LOTRIMIN) 1 % cream Apply to affected area 2 times daily Patient taking differently: Apply 1 application  topically daily as needed (infection). 02/02/20   Couture, Cortni S, PA-C    Current Outpatient Medications  Medication Sig Dispense Refill   buPROPion (WELLBUTRIN XL) 150 MG 24 hr tablet Take 300 mg by mouth daily.     escitalopram (LEXAPRO) 10 MG tablet Take 10 mg by mouth daily.     gabapentin (NEURONTIN) 100 MG capsule Take 1 capsule (100 mg total) by mouth 2 (two) times daily. 30 capsule 0   propranolol ER (INDERAL LA) 80 MG 24 hr capsule Take 80 mg by mouth daily.     rosuvastatin (CRESTOR) 20 MG tablet Take 20 mg by mouth daily.     SUMAtriptan (IMITREX) 100 MG tablet Take 100 mg by mouth daily as needed for migraine.     topiramate (TOPAMAX) 100 MG tablet Take 100 mg by mouth daily.     clotrimazole (LOTRIMIN) 1 % cream Apply to affected  area 2 times daily (Patient taking differently: Apply 1 application  topically daily as needed (infection).) 15 g 0   Current Facility-Administered Medications  Medication Dose Route Frequency Provider Last Rate Last Admin   0.9 %  sodium chloride infusion  500 mL Intravenous Once Khylie Larmore, Willaim Rayas, MD        Allergies as of 03/23/2023 - Review Complete 03/23/2023  Allergen Reaction Noted   Amoxicillin Other (See Comments) 02/07/2018   Penicillins Rash 02/12/2015    Family History  Problem Relation Age of Onset   Alzheimer's disease Mother    Kidney disease Father    Ulcers Father    Gout Father    Breast cancer Paternal Grandmother    Colon cancer Neg Hx    Esophageal cancer Neg Hx    Rectal  cancer Neg Hx    Stomach cancer Neg Hx     Social History   Socioeconomic History   Marital status: Single    Spouse name: Not on file   Number of children: Not on file   Years of education: Not on file   Highest education level: Not on file  Occupational History   Not on file  Tobacco Use   Smoking status: Former   Smokeless tobacco: Never  Vaping Use   Vaping Use: Never used  Substance and Sexual Activity   Alcohol use: No    Alcohol/week: 0.0 standard drinks of alcohol   Drug use: No   Sexual activity: Not Currently    Birth control/protection: None  Other Topics Concern   Not on file  Social History Narrative   Not on file   Social Determinants of Health   Financial Resource Strain: Not on file  Food Insecurity: Not on file  Transportation Needs: Not on file  Physical Activity: Not on file  Stress: Not on file  Social Connections: Not on file  Intimate Partner Violence: Not on file    Review of Systems: All other review of systems negative except as mentioned in the HPI.  Physical Exam: Vital signs BP 114/72   Pulse 72   Temp 98.6 F (37 C) (Temporal)   Ht 5\' 3"  (1.6 m)   Wt 251 lb (113.9 kg)   LMP 03/28/2019   SpO2 98%   BMI 44.46 kg/m   General:   Alert,  Well-developed, pleasant and cooperative in NAD Lungs:  Clear throughout to auscultation.   Heart:  Regular rate and rhythm Abdomen:  Soft, nontender and nondistended.   Neuro/Psych:  Alert and cooperative. Normal mood and affect. A and O x 3  Harlin Rain, MD [facility]

## 2023-03-23 NOTE — Patient Instructions (Signed)
Handout on hemorrhoids given to patient Resume previous diet and continue present medications Repeat colonoscopy for surveillance in 5 years    YOU HAD AN ENDOSCOPIC PROCEDURE TODAY AT THE [facility]:   Refer to the procedure report that was given to you for any specific questions about what was found during the examination.  If the procedure report does not answer your questions, please call your gastroenterologist to clarify.  If you requested that your care partner not be given the details of your procedure findings, then the procedure report has been included in a sealed envelope for you to review at your convenience later.  YOU SHOULD EXPECT: Some feelings of bloating in the abdomen. Passage of more gas than usual.  Walking can help get rid of the air that was put into your GI tract during the procedure and reduce the bloating. If you had a lower endoscopy (such as a colonoscopy or flexible sigmoidoscopy) you may notice spotting of blood in your stool or on the toilet paper. If you underwent a bowel prep for your procedure, you may not have a normal bowel movement for a few days.  Please Note:  You might notice some irritation and congestion in your nose or some drainage.  This is from the oxygen used during your procedure.  There is no need for concern and it should clear up in a day or so.  SYMPTOMS TO REPORT IMMEDIATELY:  Following lower endoscopy (colonoscopy or flexible sigmoidoscopy):  Excessive amounts of blood in the stool  Significant tenderness or worsening of abdominal pains  Swelling of the abdomen that is new, acute  Fever of 100F or higher  For urgent or emergent issues, a gastroenterologist can be reached at any hour by calling [phone]. Do not use MyChart messaging for urgent concerns.    DIET:  We do recommend a small meal at first, but then you may proceed to your regular diet.  Drink plenty of fluids but you should avoid alcoholic beverages for 24  hours.  ACTIVITY:  You should plan to take it easy for the rest of today and you should NOT DRIVE or use heavy machinery until tomorrow (because of the sedation medicines used during the test).    FOLLOW UP: Our staff will call the number listed on your records the next business day following your procedure.  We will call around 7:15- 8:00 am to check on you and address any questions or concerns that you may have regarding the information given to you following your procedure. If we do not reach you, we will leave a message.     If any biopsies were taken you will be contacted by phone or by letter within the next 1-3 weeks.  Please call us at [phone] if you have not heard about the biopsies in 3 weeks.    SIGNATURES/CONFIDENTIALITY: You and/or your care partner have signed paperwork which will be entered into your electronic medical record.  These signatures attest to the fact that that the information above on your After Visit Summary has been reviewed and is understood.  Full responsibility of the confidentiality of this discharge information lies with you and/or your care-partner.

## 2023-03-23 NOTE — Op Note (Signed)
[facility] Patient Name: Bianca Fletcher Procedure Date: 03/23/2023 10:14 AM MRN: [id number] Endoscopist: Viviann Spare P. Adela Lank , MD, [id number] Age: 57 Referring MD:  Date of Birth: 1966-07-23 Gender: Female Account #: [id number] Procedure:                Colonoscopy Indications:              High risk colon cancer surveillance: Personal                            history of colonic polyps - 6 polyps removed in                            04/2018 - largest 1cm in size Medicines:                Monitored Anesthesia Care Procedure:                Pre-Anesthesia Assessment:                           - Prior to the procedure, a History and Physical                            was performed, and patient medications and                            allergies were reviewed. The patient's tolerance of                            previous anesthesia was also reviewed. The risks                            and benefits of the procedure and the sedation                            options and risks were discussed with the patient.                            All questions were answered, and informed consent                            was obtained. Prior Anticoagulants: The patient has                            taken no anticoagulant or antiplatelet agents. ASA                            Grade Assessment: III - A patient with severe                            systemic disease. After reviewing the risks and                            benefits, the patient was deemed in satisfactory  condition to undergo the procedure.                           After obtaining informed consent, the colonoscope                            was passed under direct vision. Throughout the                            procedure, the patient's blood pressure, pulse, and                            oxygen saturations were monitored continuously. The                            Olympus CF-HQ190L  ([id number]) Colonoscope was                            introduced through the anus and advanced to the the                            cecum, identified by appendiceal orifice and                            ileocecal valve. The colonoscopy was performed                            without difficulty. The patient tolerated the                            procedure well. The quality of the bowel                            preparation was good. The ileocecal valve,                            appendiceal orifice, and rectum were photographed. Scope In: 10:21:29 AM Scope Out: 10:34:15 AM Scope Withdrawal Time: 0 hours 11 minutes 3 seconds  Total Procedure Duration: 0 hours 12 minutes 46 seconds  Findings:                 The perianal and digital rectal examinations were                            normal.                           Internal hemorrhoids were found during retroflexion.                           The exam was otherwise without abnormality. Complications:            No immediate complications. Estimated blood loss:                            None. Estimated Blood Loss:  Estimated blood loss: none. Impression:               - Internal hemorrhoids.                           - The examination was otherwise normal.                           - No polyps Recommendation:           - Patient has a contact number available for                            emergencies. The signs and symptoms of potential                            delayed complications were discussed with the                            patient. Return to normal activities tomorrow.                            Written discharge instructions were provided to the                            patient.                           - Resume previous diet.                           - Continue present medications.                           - Repeat colonoscopy in 5 years for surveillance                            given burden / size of  polyps on the last exam. Viviann Spare P. Adela Lank, MD 03/23/2023 10:38:13 AM This report has been signed electronically.

## 2023-03-24 ENCOUNTER — Telehealth: Payer: Self-pay

## 2023-03-24 NOTE — Telephone Encounter (Signed)
  Follow up Call-     03/23/2023    9:58 AM  Call back number  Post procedure Call Back phone  # [phone]  Permission to leave phone message Yes     Patient questions:  Do you have a fever, pain , or abdominal swelling? No. Pain Score  0 *  Have you tolerated food without any problems? Yes.    Have you been able to return to your normal activities? Yes.    Do you have any questions about your discharge instructions: Diet   No. Medications  No. Follow up visit  No.  Do you have questions or concerns about your Care? No.  Actions: * If pain score is 4 or above: No action needed, pain <4.

## 2023-08-15 ENCOUNTER — Other Ambulatory Visit: Payer: Self-pay

## 2023-08-15 ENCOUNTER — Encounter (HOSPITAL_COMMUNITY): Payer: Self-pay | Admitting: Emergency Medicine

## 2023-08-15 ENCOUNTER — Emergency Department (HOSPITAL_COMMUNITY)
Admission: EM | Admit: 2023-08-15 | Discharge: 2023-08-15 | Disposition: A | Discharge status: Home / Self Care | Payer: 59 | Attending: Emergency Medicine | Admitting: Emergency Medicine

## 2023-08-15 DIAGNOSIS — R102 Pelvic and perineal pain: Secondary | ICD-10-CM | POA: Diagnosis present

## 2023-08-15 DIAGNOSIS — N898 Other specified noninflammatory disorders of vagina: Secondary | ICD-10-CM | POA: Insufficient documentation

## 2023-08-15 DIAGNOSIS — L304 Erythema intertrigo: Secondary | ICD-10-CM | POA: Diagnosis not present

## 2023-08-15 DIAGNOSIS — Z79899 Other long term (current) drug therapy: Secondary | ICD-10-CM | POA: Diagnosis not present

## 2023-08-15 LAB — WET PREP, GENITAL
Clue Cells Wet Prep HPF POC: NONE SEEN
Sperm: NONE SEEN
Trich, Wet Prep: NONE SEEN
WBC, Wet Prep HPF POC: <10 (ref <10)
Yeast Wet Prep HPF POC: NONE SEEN

## 2023-08-15 MED ORDER — FLUCONAZOLE 150 MG PO TABS
150.0000 mg | ORAL_TABLET | Freq: Once | ORAL | Status: AC
  Administered 2023-08-15: 150 mg via ORAL
  Filled 2023-08-15: qty 1

## 2023-08-15 MED ORDER — KETOCONAZOLE 2 % EX CREA: 1.0000 | TOPICAL_CREAM | Freq: Every day | CUTANEOUS | 0 refills | Status: AC

## 2023-08-15 MED ORDER — FLUCONAZOLE 150 MG PO TABS
150.0000 mg | ORAL_TABLET | Freq: Every day | ORAL | 0 refills | Status: AC
Start: 2023-08-18 — End: ?

## 2023-08-15 MED ORDER — NYSTATIN 100000 UNIT/GM EX POWD
Freq: Once | CUTANEOUS | Status: AC
  Filled 2023-08-15: qty 15

## 2023-08-15 NOTE — ED Triage Notes (Signed)
Pt BIB EMS from home with c/o intermittent vaginal pain with discharge x 2 years. States that she normally gets cream for it from the doctor.

## 2023-08-15 NOTE — ED Notes (Signed)
Spoke to patient guardian Maralyn Sago and pt will take cab home. Pt called [employer] on my phone

## 2023-08-15 NOTE — ED Provider Notes (Signed)
[place] EMERGENCY DEPARTMENT AT [facility] Provider Note   CSN: [id number] Arrival date & time: 08/15/23  6213     History  Chief Complaint  Patient presents with   Pelvic Pain    Bianca Fletcher is a 57 y.o. female, history of intellectual disability, who presents to the ED secondary to vaginal discharge is been going on for the last 2 years.  She states she has been treated multiple times for this, but it still keeps coming back.  States it is itching, and burning down there, and hurts around her buttocks as well.  Denies any fever, chills.     Home Medications Prior to Admission medications   Medication Sig Start Date End Date Taking? Authorizing Provider  fluconazole (DIFLUCAN) 150 MG tablet Take 1 tablet (150 mg total) by mouth daily. 08/18/23  Yes Derry Arbogast L, PA  ketoconazole (NIZORAL) 2 % cream Apply 1 Application topically daily. 08/15/23  Yes Annarae Macnair L, PA  buPROPion (WELLBUTRIN XL) 150 MG 24 hr tablet Take 300 mg by mouth daily.    [provider]  clotrimazole (LOTRIMIN) 1 % cream Apply to affected area 2 times daily Patient taking differently: Apply 1 application  topically daily as needed (infection). 02/02/20   Couture, Cortni S, PA-C  escitalopram (LEXAPRO) 10 MG tablet Take 10 mg by mouth daily. 03/09/16   [provider]  gabapentin (NEURONTIN) 100 MG capsule Take 1 capsule (100 mg total) by mouth 2 (two) times daily. 09/09/21   Almond Lint, MD  propranolol ER (INDERAL LA) 80 MG 24 hr capsule Take 80 mg by mouth daily. 05/21/21   [provider]  rosuvastatin (CRESTOR) 20 MG tablet Take 20 mg by mouth daily. 04/15/21   [provider]  SUMAtriptan (IMITREX) 100 MG tablet Take 100 mg by mouth daily as needed for migraine. 02/20/21   [provider]  topiramate (TOPAMAX) 100 MG tablet Take 100 mg by mouth daily. 05/17/21   [provider]      Allergies    Amoxicillin and Penicillins    Review  of Systems   Review of Systems  Genitourinary:  Positive for pelvic pain and vaginal discharge. Negative for vaginal bleeding.    Physical Exam Updated Vital Signs BP 116/79   Pulse 85   Temp 98.1 F (36.7 C)   Resp 18   Ht 5\' 3"  (1.6 m)   Wt 113 kg   LMP 03/28/2019   SpO2 100%   BMI 44.13 kg/m  Physical Exam Vitals and nursing note reviewed.  Constitutional:      General: She is not in acute distress.    Appearance: She is well-developed.  HENT:     Head: Normocephalic and atraumatic.  Eyes:     Conjunctiva/sclera: Conjunctivae normal.  Cardiovascular:     Rate and Rhythm: Normal rate and regular rhythm.     Heart sounds: No murmur heard. Pulmonary:     Effort: Pulmonary effort is normal. No respiratory distress.     Breath sounds: Normal breath sounds.  Abdominal:     Palpations: Abdomen is soft.     Tenderness: There is no abdominal tenderness.  Genitourinary:    Comments: Chaperone present, diffuse amount of white discharge, and vaginal cavity, further eval limited given patient's request for me to stop the exam.  Diffuse amount of erythematous satellite lesions, around the vulva, and perineum and buttocks Musculoskeletal:        General: No swelling.  Cervical back: Neck supple.  Skin:    General: Skin is warm and dry.     Capillary Refill: Capillary refill takes less than 2 seconds.  Neurological:     Mental Status: She is alert.  Psychiatric:        Mood and Affect: Mood normal.     ED Results / Procedures / Treatments   Labs (all labs ordered are listed, but only abnormal results are displayed) Labs Reviewed  WET PREP, GENITAL  GC/CHLAMYDIA PROBE AMP ([place]) NOT AT [facility]    EKG None  Radiology No results found.  Procedures Procedures    Medications Ordered in ED Medications  nystatin (MYCOSTATIN/NYSTOP) topical powder (has no administration in time range)  fluconazole (DIFLUCAN) tablet 150 mg (has no administration in time  range)    ED Course/ Medical Decision Making/ A&P                                 Medical Decision Making Is a 57 year old female, here for lesions on her buttock, and groin, and vaginal discharge has been going on for last 2 years, intermittently comes and goes.  States it is tender.  She has a large amount of white discharge, in her vaginal cavity, she did not tolerate a speculum exam, thus it had to be ended early.  May have not gotten a good sample, on her wet prep.  Amount and/or Complexity of Data Reviewed Labs: ordered.    Details: Negative for any kind of yeast, BV Discussion of management or test interpretation with external provider(s): Discussed with patient, may have had inaccurate wet prep, we will go ahead and treat her with Diflucan, given the intertrigo, and send her home on ketoconazole.  Will have her follow-up with her PCP.  She declined further speculum exam for retest.  We discussed return precautions and she voiced understanding.  Risk Prescription drug management.    Final Clinical Impression(s) / ED Diagnoses Final diagnoses:  Vaginal discharge  Intertrigo    Rx / DC Orders ED Discharge Orders          Ordered    fluconazole (DIFLUCAN) 150 MG tablet  Daily        08/15/23 1043    ketoconazole (NIZORAL) 2 % cream  Daily        08/15/23 1043              Migdalia Olejniczak L, PA 08/15/23 1046    Virgina Norfolk, DO 08/15/23 1148

## 2023-08-15 NOTE — Discharge Instructions (Addendum)
Follow-up with your primary care doctor, and your gynecologist.  Return to the ER if you feel like your symptoms are worsening.  I have sent your medicine to the pharmacy.  Please pick it up.  Need to keep the powder on at all times, and cleanse the area daily, and pat it dry.  Is very important that you keep this area dry, as yeast breeds when there is a moisture.

## 2023-08-16 LAB — GC/CHLAMYDIA PROBE AMP (~~LOC~~) NOT AT ARMC
Chlamydia: NEGATIVE
Comment: NEGATIVE
Comment: NORMAL
Neisseria Gonorrhea: NEGATIVE

## 2023-09-06 NOTE — Assessment & Plan Note (Signed)
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 Left arm swelling: Requested Charlene Brooke her physical therapist evaluate her.  She will get an appointment to set up for an outpatient evaluation as well.  Breast cancer surveillance: Chest exam 09/06/2023: Benign Mammograms: Since she had bilateral mastectomies there is no role of imaging studies.   Return to clinic in 1 year for follow-up with Mardella Layman for long-term survivorship.

## 2023-09-08 ENCOUNTER — Telehealth: Payer: Self-pay | Admitting: *Deleted

## 2023-09-08 ENCOUNTER — Telehealth: Payer: Self-pay | Admitting: Hematology and Oncology

## 2023-09-08 ENCOUNTER — Inpatient Hospital Stay: Payer: 59 | Admitting: Hematology and Oncology

## 2023-09-08 DIAGNOSIS — D0512 Intraductal carcinoma in situ of left breast: Secondary | ICD-10-CM

## 2023-09-08 NOTE — Telephone Encounter (Signed)
Received call from pt stating she was under the impression that she no longer needed to f/u with MD.  RN advised pt that she will still need to be seen yearly.  Pt states she will contact her caregiver that brings her to her appts and will reach out to [place] for a date to schedule.

## 2024-04-26 ENCOUNTER — Emergency Department (HOSPITAL_COMMUNITY)
Admission: EM | Admit: 2024-04-26 | Discharge: 2024-04-26 | Disposition: A | Discharge status: Home / Self Care | Attending: Emergency Medicine | Admitting: Emergency Medicine

## 2024-04-26 ENCOUNTER — Emergency Department (HOSPITAL_COMMUNITY)

## 2024-04-26 ENCOUNTER — Other Ambulatory Visit: Payer: Self-pay

## 2024-04-26 ENCOUNTER — Encounter (HOSPITAL_COMMUNITY): Payer: Self-pay

## 2024-04-26 DIAGNOSIS — R1031 Right lower quadrant pain: Secondary | ICD-10-CM | POA: Diagnosis not present

## 2024-04-26 DIAGNOSIS — M545 Low back pain, unspecified: Secondary | ICD-10-CM | POA: Insufficient documentation

## 2024-04-26 LAB — CBC WITH DIFFERENTIAL/PLATELET
Abs Immature Granulocytes: 0.02 K/uL (ref 0.00–0.07)
Basophils Absolute: 0 K/uL (ref 0.0–0.1)
Basophils Relative: 0 %
Eosinophils Absolute: 0.2 K/uL (ref 0.0–0.5)
Eosinophils Relative: 3 %
HCT: 43.9 % (ref 36.0–46.0)
Hemoglobin: 13.5 g/dL (ref 12.0–15.0)
Immature Granulocytes: 0 %
Lymphocytes Relative: 28 %
Lymphs Abs: 2.3 K/uL (ref 0.7–4.0)
MCH: 27.3 pg (ref 26.0–34.0)
MCHC: 30.8 g/dL (ref 30.0–36.0)
MCV: 88.9 fL (ref 80.0–100.0)
Monocytes Absolute: 0.4 K/uL (ref 0.1–1.0)
Monocytes Relative: 5 %
Neutro Abs: 5.3 K/uL (ref 1.7–7.7)
Neutrophils Relative %: 64 %
Platelets: 312 K/uL (ref 150–400)
RBC: 4.94 MIL/uL (ref 3.87–5.11)
RDW: 14 % (ref 11.5–15.5)
WBC: 8.3 K/uL (ref 4.0–10.5)
nRBC: 0 % (ref 0.0–0.2)

## 2024-04-26 LAB — COMPREHENSIVE METABOLIC PANEL WITH GFR
ALT: 22 U/L (ref 0–44)
AST: 18 U/L (ref 15–41)
Albumin: 3.9 g/dL (ref 3.5–5.0)
Alkaline Phosphatase: 89 U/L (ref 38–126)
Anion gap: 9 (ref 5–15)
BUN: 10 mg/dL (ref 6–20)
CO2: 22 mmol/L (ref 22–32)
Calcium: 9.1 mg/dL (ref 8.9–10.3)
Chloride: 110 mmol/L (ref 98–111)
Creatinine, Ser: 0.82 mg/dL (ref 0.44–1.00)
GFR, Estimated: >60 mL/min (ref >60)
Glucose, Bld: 104 mg/dL — ABNORMAL HIGH (ref 70–99)
Potassium: 3.8 mmol/L (ref 3.5–5.1)
Sodium: 141 mmol/L (ref 135–145)
Total Bilirubin: 0.5 mg/dL (ref 0.0–1.2)
Total Protein: 7.6 g/dL (ref 6.5–8.1)

## 2024-04-26 LAB — LIPASE, BLOOD: Lipase: 30 U/L (ref 11–51)

## 2024-04-26 MED ORDER — MORPHINE SULFATE (PF) 4 MG/ML IV SOLN
4.0000 mg | Freq: Once | INTRAVENOUS | Status: AC
  Administered 2024-04-26: 4 mg via INTRAVENOUS
  Filled 2024-04-26: qty 1

## 2024-04-26 MED ORDER — OXYCODONE-ACETAMINOPHEN 5-325 MG PO TABS: 1.0000 | ORAL_TABLET | Freq: Four times a day (QID) | ORAL | 0 refills | Status: AC | PRN

## 2024-04-26 MED ORDER — PREDNISONE 50 MG PO TABS: 50.0000 mg | ORAL_TABLET | Freq: Every day | ORAL | 0 refills | Status: AC

## 2024-04-26 MED ORDER — KETOROLAC TROMETHAMINE 15 MG/ML IJ SOLN
15.0000 mg | Freq: Once | INTRAMUSCULAR | Status: AC
  Administered 2024-04-26: 15 mg via INTRAVENOUS
  Filled 2024-04-26: qty 1

## 2024-04-26 MED ORDER — PREDNISONE 20 MG PO TABS
60.0000 mg | ORAL_TABLET | Freq: Once | ORAL | Status: AC
Start: 2024-04-26 — End: 2024-04-26
  Administered 2024-04-26: 60 mg via ORAL
  Filled 2024-04-26: qty 3

## 2024-04-26 MED ORDER — ONDANSETRON HCL 4 MG/2ML IJ SOLN
4.0000 mg | Freq: Once | INTRAMUSCULAR | Status: AC
  Administered 2024-04-26: 4 mg via INTRAVENOUS
  Filled 2024-04-26: qty 2

## 2024-04-26 MED ORDER — IOHEXOL 300 MG/ML  SOLN
100.0000 mL | Freq: Once | INTRAMUSCULAR | Status: AC | PRN
  Administered 2024-04-26: 100 mL via INTRAVENOUS

## 2024-04-26 MED ORDER — LIDOCAINE 5 % EX PTCH: 1.0000 | MEDICATED_PATCH | CUTANEOUS | 0 refills | Status: AC

## 2024-04-26 NOTE — ED Notes (Signed)
 Pt to CT

## 2024-04-26 NOTE — Discharge Instructions (Signed)
 It was a pleasure taking care of you here today.  I have written you for a few medications to help with your symptoms  Make sure to follow-up outpatient, return for any worsening symptoms

## 2024-04-26 NOTE — ED Triage Notes (Signed)
 Pt bib EMS from home. CO lower back pain. Went to [facility] and urine test came back negative. Did ultrasound and they didn't see anything. Says pain has gotten worse. No hx of kidney stones. No frequent urination. Denies vomiting or diarrhea.

## 2024-04-26 NOTE — ED Provider Notes (Signed)
 [place] EMERGENCY DEPARTMENT AT [facility] Provider Note   CSN: [id number] Arrival date & time: 04/26/24  1618     Patient presents with: Abdominal Pain   Bianca Fletcher is a 58 y.o. female here for evaluation of right lower back pain.  Will occasionally have abdominal pain to RLQ, not currently.  Was seen by her OB/GYN today who had a negative urinalysis.  The scheduled an ultrasound for Monday.  Pain worse with movement, such as bending, twisting, lifting up her leg..  No fever, nausea, vomiting, pelvic pain, vaginal discharge, dysuria or hematuria.  No numbness or weakness to her lower extremities.  No saddle anesthesia.  Pain does not extend down leg however does go into her right gluteal region.  Patient has very difficult time describing symptoms   Patient has guardian, medical POA due to intellectual disabilities.   HPI     Prior to Admission medications   Medication Sig Start Date End Date Taking? Authorizing Provider  lidocaine  (LIDODERM ) 5 % Place 1 patch onto the skin daily. Remove & Discard patch within 12 hours or as directed by MD 04/26/24  Yes Wynema Garoutte A, PA-C  oxyCODONE -acetaminophen  (PERCOCET/ROXICET) 5-325 MG tablet Take 1 tablet by mouth every 6 (six) hours as needed for severe pain (pain score 7-10). 04/26/24  Yes Riddik Senna A, PA-C  predniSONE  (DELTASONE ) 50 MG tablet Take 1 tablet (50 mg total) by mouth daily for 5 days. 04/26/24 05/01/24 Yes Sirus Labrie A, PA-C  buPROPion  (WELLBUTRIN  XL) 150 MG 24 hr tablet Take 300 mg by mouth daily.    [provider]  clotrimazole  (LOTRIMIN ) 1 % cream Apply to affected area 2 times daily Patient taking differently: Apply 1 application  topically daily as needed (infection). 02/02/20   Couture, Cortni S, PA-C  escitalopram  (LEXAPRO ) 10 MG tablet Take 10 mg by mouth daily. 03/09/16   [provider]  fluconazole  (DIFLUCAN ) 150 MG tablet Take 1 tablet (150 mg total) by mouth daily.  08/18/23   Small, Brooke L, PA  gabapentin  (NEURONTIN ) 100 MG capsule Take 1 capsule (100 mg total) by mouth 2 (two) times daily. 09/09/21   Aron Shoulders, MD  ketoconazole  (NIZORAL ) 2 % cream Apply 1 Application topically daily. 08/15/23   Small, Brooke L, PA  propranolol  ER (INDERAL  LA) 80 MG 24 hr capsule Take 80 mg by mouth daily. 05/21/21   [provider]  rosuvastatin  (CRESTOR ) 20 MG tablet Take 20 mg by mouth daily. 04/15/21   [provider]  SUMAtriptan  (IMITREX ) 100 MG tablet Take 100 mg by mouth daily as needed for migraine. 02/20/21   [provider]  topiramate  (TOPAMAX ) 100 MG tablet Take 100 mg by mouth daily. 05/17/21   [provider]    Allergies: Amoxicillin and Penicillins    Review of Systems  Constitutional: Negative.   HENT: Negative.    Respiratory: Negative.    Cardiovascular: Negative.   Gastrointestinal:  Positive for abdominal distention, abdominal pain, anal bleeding, blood in stool, constipation, diarrhea, nausea, rectal pain and vomiting.  Genitourinary: Negative.   Musculoskeletal:  Positive for back pain. Negative for neck pain and neck stiffness.  Skin: Negative.   Neurological: Negative.   All other systems reviewed and are negative.   Updated Vital Signs BP 137/79 (BP Location: Right Arm)   Pulse 67   Temp 99.2 F (37.3 C) (Oral)   Resp 18   Ht 5' 3 (1.6 m)   Wt 120.2 kg  LMP 03/28/2019   SpO2 95%   BMI 46.94 kg/m   Physical Exam Vitals and nursing note reviewed.  Constitutional:      General: She is not in acute distress.    Appearance: She is well-developed. She is not ill-appearing, toxic-appearing or diaphoretic.  HENT:     Head: Atraumatic.  Eyes:     Pupils: Pupils are equal, round, and reactive to light.  Cardiovascular:     Rate and Rhythm: Normal rate.     Pulses: Normal pulses.          Radial pulses are 2+ on the right side and 2+ on the left side.       Dorsalis pedis pulses are 2+ on  the right side and 2+ on the left side.     Heart sounds: Normal heart sounds.  Pulmonary:     Effort: Pulmonary effort is normal. No respiratory distress.     Breath sounds: Normal breath sounds and air entry.     Comments: Clear bilaterally, speaks in full sentences without difficulty Chest:     Comments: Nontender chest wall Abdominal:     General: Bowel sounds are normal. There is no distension.     Palpations: Abdomen is soft.     Tenderness: There is no abdominal tenderness.     Comments: Difficult exam due to patient compliance and body habitus.  Soft, nontender-no rebound or guarding  Musculoskeletal:        General: Normal range of motion.     Cervical back: Normal range of motion.       Back:     Comments: Tenderness right lower paraspinal region into right lower back.  Nontender bilateral upper and lower extremities.  Her compartments are soft.  Negative straight leg raise however difficult to perform exam due to patient complaints.  Skin:    General: Skin is warm and dry.     Capillary Refill: Capillary refill takes less than 2 seconds.     Comments: No edema, erythema or warmth.  No rashes or lesions  Neurological:     General: No focal deficit present.     Mental Status: She is alert.     Cranial Nerves: Cranial nerves 2-12 are intact.     Sensory: Sensation is intact.     Motor: Motor function is intact.     Coordination: Coordination is intact.     Gait: Gait is intact.  Psychiatric:        Mood and Affect: Mood normal.     (all labs ordered are listed, but only abnormal results are displayed) Labs Reviewed  COMPREHENSIVE METABOLIC PANEL WITH GFR - Abnormal; Notable for the following components:      Result Value   Glucose, Bld 104 (*)    All other components within normal limits  LIPASE, BLOOD  CBC WITH DIFFERENTIAL/PLATELET  URINALYSIS, ROUTINE W REFLEX MICROSCOPIC    EKG: None  Radiology: CT L-SPINE NO CHARGE Result Date: 04/26/2024 CLINICAL  DATA:  Low back pain EXAM: CT LUMBAR SPINE WITHOUT CONTRAST TECHNIQUE: Multidetector CT imaging of the lumbar spine was performed without intravenous contrast administration. Multiplanar CT image reconstructions were also generated. RADIATION DOSE REDUCTION: This exam was performed according to the departmental dose-optimization program which includes automated exposure control, adjustment of the mA and/or kV according to patient size and/or use of iterative reconstruction technique. COMPARISON:  None Available. FINDINGS: Segmentation: 5 lumbar type vertebrae. Alignment: Normal. Vertebrae: No acute fracture or focal pathologic process. Paraspinal and  other soft tissues: Negative. Disc levels: There is disc space narrowing, endplate remodeling and vacuum disc phenomena L5-S1 in keeping with changes of moderate degenerative disc disease. Moderate degenerative changes are seen at L2-3. Remaining intervertebral disc spaces are preserved. Broad-based disc bulge in combination with mild bilateral facet hypertrophy noted at L2-3 resulting in mild central canal stenosis. Similar changes noted at L3-4 and L4-5 with more moderate facet arthrosis noted. No high-grade canal stenosis. Moderate facet arthrosis noted at L5-S1 without significant stenosis. Moderate bilateral neuroforaminal narrowing, however, secondary to disc osteophyte formation. IMPRESSION: 1. No acute osseous abnormality of the lumbar spine. 2. Moderate degenerative disc disease at L5-S1 with moderate bilateral neuroforaminal narrowing. 3. Mild central canal stenosis at L2-3, L3-4 and L4-5. Electronically Signed   By: Dorethia Molt M.D.   On: 04/26/2024 19:52   CT ABDOMEN PELVIS W CONTRAST Result Date: 04/26/2024 CLINICAL DATA:  Right lower quadrant abdominal pain EXAM: CT ABDOMEN AND PELVIS WITH CONTRAST TECHNIQUE: Multidetector CT imaging of the abdomen and pelvis was performed using the standard protocol following bolus administration of intravenous  contrast. RADIATION DOSE REDUCTION: This exam was performed according to the departmental dose-optimization program which includes automated exposure control, adjustment of the mA and/or kV according to patient size and/or use of iterative reconstruction technique. CONTRAST:  OMNIPAQUE  IOHEXOL  300 MG/ML  SOLN COMPARISON:  None FINDINGS: Lower chest: Right basilar atelectasis. The size is borderline normal. No pericardial fluid. Hepatobiliary: Mild hepatomegaly measuring 17.8 cm in craniocaudal dimension. Hepatic steatosis. No focal liver lesion possible cholelithiasis. Normal gallbladder wall thickness. CBD slightly prominent measuring 10 mm and tapers to 6 mm at the pancreatic head. No definite choledocholithiasis identified. Pancreas: Mildly atrophic changes of the pancreas. No ductal dilatation Spleen: Normal in size without focal abnormality. Adrenals/Urinary Tract: Adrenal glands are unremarkable. Kidneys are normal, without renal calculi, focal lesion, or hydronephrosis. Bladder is unremarkable. Stomach/Bowel: Hiatal hernia. Stomach is within normal limits. Appendix appears normal. No evidence of bowel wall thickening, distention, or inflammatory changes. Scattered colonic diverticula without diverticulitis. Vascular/Lymphatic: No significant vascular findings are present. No enlarged abdominal or pelvic lymph nodes. No fat stranding or free fluid. Reproductive: Heterogeneous retroverted fibroid uterus. Normal bilateral ovaries. Other: Fat containing umbilical hernia measuring 2.3 cm with a defect measuring 1.2 cm Musculoskeletal: Mild narrowing of the L5-S1 disc space. IMPRESSION: Mild hepatomegaly with hepatic steatosis. Query cholelithiasis. Mild dilation of common bile duct. Recommend ultrasound for further assessment. Scattered colonic diverticula without diverticulitis. Heterogeneous fibroid uterus. Degenerative disc disease involving L5-S1. Electronically Signed   By: Megan  Zare M.D.   On:  04/26/2024 19:46     Procedures   Medications Ordered in the ED  morphine  (PF) 4 MG/ML injection 4 mg (4 mg Intravenous Given 04/26/24 1746)  ondansetron  (ZOFRAN ) injection 4 mg (4 mg Intravenous Given 04/26/24 1746)  iohexol  (OMNIPAQUE ) 300 MG/ML solution 100 mL (100 mLs Intravenous Contrast Given 04/26/24 1918)  ketorolac  (TORADOL ) 15 MG/ML injection 15 mg (15 mg Intravenous Given 04/26/24 2036)  predniSONE  (DELTASONE ) tablet 60 mg (60 mg Oral Given 04/26/24 2032)  morphine  (PF) 4 MG/ML injection 4 mg (4 mg Intravenous Given 04/26/24 2180)   58 year old here for evaluation of right lower back pain.  Worse with movement.  Occasionally has had some right lower quadrant abdominal pain however none currently.  No numbness or weakness.  No bowel or bladder incontinence, saddle paresthesia.  States she was seen by GYN earlier today who did a urinalysis which was negative for infection.  There was plan to do an ultrasound on Monday.  She is afebrile, nonseptic, non-ill-appearing.  She is tenderness to her right lower back.  She has negative CVA tap.  Her abdomen is soft, nontender.  Negative straight leg raise.  Pain is reproducible on exam.  No overlying rashes or lesions.  No red flag symptoms for back pain to suggest cauda equina, discitis, osteomyelitis, transverse mellitus, psoas abscess.  Labs and imaging personally viewed interpreted  CBC without leukocytosis, hemoglobin 13.5 Metabolic panel without significant abnormality Lipase 30 CT abdomen pelvis with hepatic steatosis, query cholelithiasis, rec US , fibroid uterus, diverticula without diverticulitis. CT lumbar degenerative disease, mild canal stenosis UA----patient declined to provide urine.  States this was performed at another office.  I discussed we are not able to visualize this however patient states she does not want to provide a sample at this time.  Discussed results with patient.  Requesting additional pain medicine.  We discussed  CT findings, possible ultrasound.  Patient does not want ultrasound.  We discussed risk versus benefit.  She states she has no abdominal pain and DECLINES ultrasound at this time.  She is eating and drinking without difficulty.  She states she will return if she develops any abdominal pain.  Discussed with patient's legal guardian Camie.  Patient can DC home via taxi, she will make sure patient gets prescriptions.  Patient ambulatory here.  Will have her follow-up outpatient, return for new or worsening symptoms.  The patient has been appropriately medically screened and/or stabilized in the ED. I have low suspicion for any other emergent medical condition which would require further screening, evaluation or treatment in the ED or require inpatient management.  Patient is hemodynamically stable and in no acute distress.  Patient able to ambulate in department prior to ED.  Evaluation does not show acute pathology that would require ongoing or additional emergent interventions while in the emergency department or further inpatient treatment.  I have discussed the diagnosis with the patient and answered all questions.  Pain is been managed while in the emergency department and patient has no further complaints prior to discharge.  Patient is comfortable with plan discussed in room and is stable for discharge at this time.  I have discussed strict return precautions for returning to the emergency department.  Patient was encouraged to follow-up with PCP/specialist refer to at discharge.                                   Medical Decision Making Amount and/or Complexity of Data Reviewed External Data Reviewed: labs, radiology and notes. Labs: ordered. Decision-making details documented in ED Course. Radiology: ordered and independent interpretation performed. Decision-making details documented in ED Course. ECG/medicine tests: ordered and independent interpretation performed. Decision-making details  documented in ED Course.  Risk OTC drugs. Prescription drug management. Parenteral controlled substances. Decision regarding hospitalization. Diagnosis or treatment significantly limited by social determinants of health.      Final diagnoses:  Acute right-sided low back pain without sciatica    ED Discharge Orders          Ordered    predniSONE  (DELTASONE ) 50 MG tablet  Daily        04/26/24 2112    lidocaine  (LIDODERM ) 5 %  Every 24 hours        04/26/24 2112    oxyCODONE -acetaminophen  (PERCOCET/ROXICET) 5-325 MG tablet  Every 6 hours PRN  04/26/24 2112               Payten Beaumier A, PA-C 04/26/24 2238    Pamella Ozell LABOR, DO 04/28/24 1831

## 2024-04-29 ENCOUNTER — Ambulatory Visit: Payer: Self-pay

## 2024-04-29 NOTE — Telephone Encounter (Addendum)
 FYI Only or Action Required?: FYI only for provider.  Patient was last seen in primary care on na.  Called Nurse Triage reporting Advice Only.  Symptoms began several days ago.  Interventions attempted: Nothing.  Symptoms are: unchanged.  Triage Disposition: Call PCP Within 24 Hours  Patient/caregiver understands and will follow disposition?:   States is waiting for a call back; someone else is scheduling her appointment.   First attempt; no answer      Copied from CRM [id number]. Topic: Clinical - Prescription Issue >> Apr 29, 2024 11:03 AM Zane F wrote: Patient called in yelling and using profanity in hopes of getting assistance with her back pain from a nurse in the [facility] ED; Specialist informed the patient that the ED could not relay clinical information per her request and suggested she contact her primary care doctor; Specialist offered to transfer the patient to the nurse for her back pain and the patient declined. The patient then explained that she is looking to get a clinical staff member to assist her in getting in contact with her pharmacy to assist her with getting the lidocaine  (LIDODERM ) 5 % patches from her pharmacy that were prescribed by the ED on 04/26/2024.   Patient got frustrated and hung up before more information could be discussed.   Please call the patient back to assist with pharmacy issue.   Callback Number: [phone]

## 2024-05-13 ENCOUNTER — Telehealth: Payer: Self-pay | Admitting: Neurology

## 2024-05-13 NOTE — Telephone Encounter (Signed)
 Request to cx due to a conflict

## 2024-05-20 ENCOUNTER — Ambulatory Visit: Admitting: Neurology
